# Patient Record
Sex: Female | Born: 1955 | Race: White | Hispanic: No | Marital: Married
Health system: Southern US, Community
[De-identification: ages and names within clinical notes are randomized; demographics above are authoritative.]

## PROBLEM LIST (undated history)

## (undated) DIAGNOSIS — M1711 Unilateral primary osteoarthritis, right knee: Secondary | ICD-10-CM

## (undated) DIAGNOSIS — Z1231 Encounter for screening mammogram for malignant neoplasm of breast: Secondary | ICD-10-CM

## (undated) DIAGNOSIS — N6325 Unspecified lump in the left breast, overlapping quadrants: Secondary | ICD-10-CM

## (undated) DIAGNOSIS — Z853 Personal history of malignant neoplasm of breast: Principal | ICD-10-CM

## (undated) DIAGNOSIS — R9230 Dense breasts, unspecified: Secondary | ICD-10-CM

## (undated) DIAGNOSIS — R350 Frequency of micturition: Secondary | ICD-10-CM

## (undated) DIAGNOSIS — I890 Lymphedema, not elsewhere classified: Secondary | ICD-10-CM

## (undated) DIAGNOSIS — R922 Inconclusive mammogram: Secondary | ICD-10-CM

## (undated) DIAGNOSIS — D508 Other iron deficiency anemias: Principal | ICD-10-CM

## (undated) DIAGNOSIS — R928 Other abnormal and inconclusive findings on diagnostic imaging of breast: Secondary | ICD-10-CM

---

## 2010-04-02 ENCOUNTER — Ambulatory Visit (HOSPITAL_COMMUNITY): Admission: RE | Admit: 2010-04-02 | Discharge: 2010-04-02 | Payer: Self-pay | Admitting: Family Medicine

## 2010-05-24 ENCOUNTER — Other Ambulatory Visit: Admission: RE | Admit: 2010-05-24 | Discharge: 2010-05-24 | Payer: Self-pay | Admitting: Obstetrics & Gynecology

## 2010-12-01 ENCOUNTER — Other Ambulatory Visit: Payer: Self-pay | Admitting: Endocrinology

## 2010-12-01 DIAGNOSIS — E049 Nontoxic goiter, unspecified: Secondary | ICD-10-CM

## 2010-12-07 ENCOUNTER — Ambulatory Visit
Admission: RE | Admit: 2010-12-07 | Discharge: 2010-12-07 | Disposition: A | Discharge status: Home / Self Care | Payer: 59 | Source: Ambulatory Visit | Attending: Endocrinology | Admitting: Endocrinology

## 2010-12-07 DIAGNOSIS — E049 Nontoxic goiter, unspecified: Secondary | ICD-10-CM

## 2011-01-31 ENCOUNTER — Ambulatory Visit (HOSPITAL_COMMUNITY)
Admission: RE | Admit: 2011-01-31 | Discharge: 2011-01-31 | Disposition: A | Discharge status: Home / Self Care | Payer: 59 | Source: Ambulatory Visit | Attending: Family Medicine | Admitting: Family Medicine

## 2011-01-31 ENCOUNTER — Other Ambulatory Visit: Payer: Self-pay | Admitting: Family Medicine

## 2011-01-31 DIAGNOSIS — M79604 Pain in right leg: Secondary | ICD-10-CM

## 2011-01-31 DIAGNOSIS — R609 Edema, unspecified: Secondary | ICD-10-CM

## 2011-01-31 DIAGNOSIS — M79609 Pain in unspecified limb: Secondary | ICD-10-CM | POA: Insufficient documentation

## 2011-01-31 DIAGNOSIS — M7989 Other specified soft tissue disorders: Secondary | ICD-10-CM | POA: Insufficient documentation

## 2011-02-01 ENCOUNTER — Emergency Department (HOSPITAL_COMMUNITY)
Admission: EM | Admit: 2011-02-01 | Discharge: 2011-02-01 | Disposition: A | Discharge status: Home / Self Care | Payer: 59 | Attending: Emergency Medicine | Admitting: Emergency Medicine

## 2011-02-01 ENCOUNTER — Emergency Department (HOSPITAL_COMMUNITY): Payer: 59

## 2011-02-01 DIAGNOSIS — M25569 Pain in unspecified knee: Secondary | ICD-10-CM | POA: Insufficient documentation

## 2011-02-01 DIAGNOSIS — E079 Disorder of thyroid, unspecified: Secondary | ICD-10-CM | POA: Insufficient documentation

## 2011-02-01 DIAGNOSIS — IMO0001 Reserved for inherently not codable concepts without codable children: Secondary | ICD-10-CM | POA: Insufficient documentation

## 2011-04-08 ENCOUNTER — Ambulatory Visit (HOSPITAL_BASED_OUTPATIENT_CLINIC_OR_DEPARTMENT_OTHER)
Admission: RE | Admit: 2011-04-08 | Discharge: 2011-04-08 | Disposition: A | Discharge status: Home / Self Care | Payer: No Typology Code available for payment source | Source: Ambulatory Visit | Attending: Orthopedic Surgery | Admitting: Orthopedic Surgery

## 2011-04-08 DIAGNOSIS — Z01812 Encounter for preprocedural laboratory examination: Secondary | ICD-10-CM | POA: Insufficient documentation

## 2011-04-08 DIAGNOSIS — Z01810 Encounter for preprocedural cardiovascular examination: Secondary | ICD-10-CM | POA: Insufficient documentation

## 2011-04-08 DIAGNOSIS — M2240 Chondromalacia patellae, unspecified knee: Secondary | ICD-10-CM | POA: Insufficient documentation

## 2011-04-08 DIAGNOSIS — M23329 Other meniscus derangements, posterior horn of medial meniscus, unspecified knee: Secondary | ICD-10-CM | POA: Insufficient documentation

## 2011-04-08 DIAGNOSIS — M6750 Plica syndrome, unspecified knee: Secondary | ICD-10-CM | POA: Insufficient documentation

## 2011-04-11 NOTE — Op Note (Signed)
NAMEODEAN, MCELWAIN               ACCOUNT NO.:  [id number]  MEDICAL RECORD NO.:  [id number]  LOCATION:                                 FACILITY:  PHYSICIAN:  Eulas Post, MD    DATE OF BIRTH:  1956-04-10  DATE OF PROCEDURE:  04/08/2011 DATE OF DISCHARGE:                              OPERATIVE REPORT   PREOPERATIVE DIAGNOSIS:  Right knee medial meniscus tear.  POSTOPERATIVE DIAGNOSES: 1. Right knee posterior horn medial meniscus tear. 2. Grade III patellar chondromalacia superiorly. 3. Grade 4 lateral femoral condyle chondromalacia in a small linear     pattern on the lateral aspect of the femoral trochlea. 4. Grade 3 extensive medial femoral condyle chondromalacia. 5. Hypertrophic medial synovial plica.  OPERATIVE PROCEDURE:  Right knee arthroscopy with partial medial meniscectomy, chondroplasty of the patellofemoral joint and the lateral femoral condyle, as well as the medial femoral condyle, and excision of synovial plica.  ATTENDING SURGEON:  Eulas Post, MD  FIRST ASSISTANT:  Janace Litten, OPA  PREOPERATIVE INDICATIONS:  Ms. Catherine Mcneil is a 55 year old woman who complained of right medial-sided knee pain and difficulty getting up and progressive loss of function.  She failed conservative measures and elected for arthroscopic intervention.  The risks, benefits and alternatives were discussed with her before the procedure including but not limited to risks of infection, bleeding, nerve injury, progression of knee pain and arthritis, incomplete relief of pain, cardiopulmonary complications, blood clots, among others and she is willing to proceed.  OPERATIVE FINDINGS:  The superior pole of the patella had an extensive uncontained area of grade 4 chondromalacia.  There was also a linear area along the femoral trochlea that had grade 4 chondral loss as well. This was primarily on the lateral aspect of the femoral trochlea.  The medial femoral condyle had a  fairly hypertrophic plica which was abrading the medial femoral condyle.  There was also extensive poorly contained grade 3 chondromalacia of the medial femoral condyle.  The tibial condyle on the medial side had some grade 3 and some grade 2 changes.  The posterior horn of the medial meniscus had some extrusion as well as a radial tear at the back.  The lateral femoral condyle was in relatively good condition with the exception of an 8 mm x 6 mm area of grade 2 chondromalacia, possibly grade 3 that was poorly contained that came into weightbearing at full extension.  The lateral meniscus was intact.  The ACL and PCL were also intact.  OPERATIVE PROCEDURE:  The patient was brought to the operating room and placed in supine position.  General anesthesia was administered.  The right lower extremity was prepped and draped in the usual sterile fashion.  Diagnostic arthroscopy was carried out with the above-named findings.  I used the arthroscopic shaver to debride the undersurface of the patella as well as the femoral trochlea and also the lateral femoral condyle and the medial femoral condyle.  I used the arthroscopic biter, as well as a shaver to debride the posterior horn of the medial meniscus back to a smooth rim.  I did not perform a microfracture given the multiple uncontained lesions.  Once  this had been achieved, I irrigated the knee copiously and removed the arthroscopic instruments and closed the portals with Monocryl and Steri-Strips and sterile gauze.  The patient was awakened and returned to PACU in stable and satisfactory condition.  There were no complications and she tolerated the procedure well.     Eulas Post, MD     JPL/MEDQ  D:  04/08/2011  T:  04/08/2011  Job:  [id number]  Electronically Signed by Teryl Lucy MD on 04/11/2011 10:54:28 AM

## 2011-06-07 ENCOUNTER — Ambulatory Visit (HOSPITAL_COMMUNITY)
Admission: RE | Admit: 2011-06-07 | Discharge: 2011-06-07 | Disposition: A | Discharge status: Home / Self Care | Payer: No Typology Code available for payment source | Source: Ambulatory Visit | Attending: Orthopedic Surgery | Admitting: Orthopedic Surgery

## 2011-06-07 DIAGNOSIS — M25569 Pain in unspecified knee: Secondary | ICD-10-CM | POA: Insufficient documentation

## 2011-06-07 DIAGNOSIS — M25669 Stiffness of unspecified knee, not elsewhere classified: Secondary | ICD-10-CM | POA: Insufficient documentation

## 2011-06-07 DIAGNOSIS — M6281 Muscle weakness (generalized): Secondary | ICD-10-CM | POA: Insufficient documentation

## 2011-06-07 DIAGNOSIS — R29898 Other symptoms and signs involving the musculoskeletal system: Secondary | ICD-10-CM | POA: Insufficient documentation

## 2011-06-07 DIAGNOSIS — IMO0001 Reserved for inherently not codable concepts without codable children: Secondary | ICD-10-CM | POA: Insufficient documentation

## 2011-06-07 NOTE — Patient Instructions (Addendum)
HEP

## 2011-06-07 NOTE — Progress Notes (Signed)
Physical Therapy Evaluation  Patient Details  Name: Catherine Mcneil MRN: [id number] Date of Birth: [birth year]  Today's Date: 06/07/2011 Time: 6578-4696 Time Calculation (min): 42 min Visit#: 1 of 8 Re-eval: 07/07/11  Past Medical History: No past medical history on file. Past Surgical History: No past surgical history on file.  Subjective Symptoms/Limitations Symptoms: Pt states that she was walking her dog and twisted her knee.  The patient state that a week latere she heard a snap in her knee and there had been significant pain on and off ever since.  The injury was approximately 4 months ago .  She had her surgery was on July 13th .  She states that she had immediate relief after the surgery.  The patient states that she has noticed that her leg seems weaker.  The patient is now being referred to therapy to maximize the patient's funciton. How long can you sit comfortably?: Pt feels stiff after sitting for a half hour but is able to sit for longer.  Pt states that she has increased pain sit to stand.   Pt states that driving bothers her knee after 30 minutes. How long can you stand comfortably?: Pt only has difficulty secondary to her back,(pt has chronic LBP). How long can you walk comfortably?: Pt is able to walk for thirty minutes. Special Tests: Pt has difficulty going down steps. Pain Assessment Currently in Pain?: No/denies (worst pain in the past week has been a 6/10)  Precautions/Restrictions     Prior Functioning  Home Living Type of Home: House Lives With: Spouse Bathroom Shower/Tub: Tub/shower unit (pt can feel knee when she is stepping over the tub.) Prior Function Level of Independence: Independent with basic ADLs Driving: Yes Able to Take Stairs Reciprically: Yes Vocation: Unemployed Leisure: Hobbies-yes (Comment) Comments: walking  Cognition Cognition Overall Cognitive Status: Appears within functional limits for tasks assessed Arousal/Alertness:  Awake/alert Orientation Level: Oriented X4  Sensation/Coordination/Flexibility    Assessment RLE AROM (degrees) Right Knee Extension 0-130: 7  Right Knee Flexion 0-140: 110  RLE Strength Right Hip Flexion: 5/5 Right Hip Extension: 4/5 Right Hip ABduction: 5/5 Right Hip ADduction: 5/5 Right Knee Flexion: 3+/5 Right Knee Extension: 3+/5 Right Ankle Dorsiflexion: 5/5  Mobility (including Balance)       Exercise/Treatments   For strengthening and stretching     Physical Therapy Assessment and Plan PT Assessment and Plan Clinical Impression Statement: Pt with decreased ROM and strength affecting ADL's Rehab Potential: Good PT Frequency: Min 2X/week PT Duration: 4 weeks PT Treatment/Interventions: Therapeutic exercise PT Plan: see two times a week for 4 weeks;  add standing functional squats, lateral step-ups, terminal extension and elliptical.    Goals PT Short Term Goals Time to Complete Goals: 2 weeks PT Short Term Goal 1: I HEP PT Short Term Goal 2: increase pain 1/2 grade to allow pt to come sit to stand without pain. PT Short Term Goal 3: ROM 0-120 to allow going over the tub with ease. PT Long Term Goals PT Long Term Goal 1: I HEP 4 wk PT Long Term Goal 2: Strength to be increased one grade to be able to walk 60 min.-4 wk Long Term Goal 3: Pain to be no greater than a 2-4 wk  Problem List Patient Active Problem List  Diagnoses  . Knee pain  . Leg weakness    PT - End of Session Equipment Utilized During Treatment: Gait belt Activity Tolerance: Patient tolerated treatment well General Behavior During Session: [facility] for  tasks performed Cognition: [facility] for tasks performed   Yavier Snider,CINDY 06/07/2011, 2:37 PM  Physician Documentation Your signature is required to indicate approval of the treatment plan as stated above.  Please sign and either send electronically or make a copy of this report for your files and return this physician signed original.     Please mark one 1.__approve of plan  2. ___approve of plan with the following conditions.   ______________________________                                                          _____________________ Physician Signature                                                                                                             Date

## 2011-06-08 ENCOUNTER — Other Ambulatory Visit: Payer: Self-pay | Admitting: Obstetrics & Gynecology

## 2011-06-08 ENCOUNTER — Other Ambulatory Visit (HOSPITAL_COMMUNITY)
Admission: RE | Admit: 2011-06-08 | Discharge: 2011-06-08 | Disposition: A | Discharge status: Home / Self Care | Payer: No Typology Code available for payment source | Source: Ambulatory Visit | Attending: Obstetrics & Gynecology | Admitting: Obstetrics & Gynecology

## 2011-06-08 DIAGNOSIS — Z01419 Encounter for gynecological examination (general) (routine) without abnormal findings: Secondary | ICD-10-CM | POA: Insufficient documentation

## 2011-06-13 ENCOUNTER — Ambulatory Visit (HOSPITAL_COMMUNITY)
Admission: RE | Admit: 2011-06-13 | Discharge: 2011-06-13 | Disposition: A | Discharge status: Home / Self Care | Payer: No Typology Code available for payment source | Source: Ambulatory Visit | Attending: *Deleted | Admitting: *Deleted

## 2011-06-13 NOTE — Progress Notes (Signed)
Physical Therapy Treatment Patient Details  Name: Catherine Mcneil MRN: [id number] Date of Birth: [birth year]  Today's Date: 06/13/2011 Time: 4098-1191 Time Calculation (min): 37 min Visit#: 2 of 8 Re-eval: 07/07/11 Charges: Therex x 30'  Subjective: Symptoms/Limitations Symptoms: No pain just stiffness. Pain Assessment Currently in Pain?: No/denies   Exercise/Treatments   06/13/11 0700  Knee Exercises: Stretches  Active Hamstring Stretch 3 reps;30 seconds  Knee Exercises: Aerobic  Elliptical 3'@L1   Knee Exercises: Standing  Lateral Step Up 10 reps;Right;Step Height: 4"  Forward Step Up 10 reps;Right;Step Height: 4"  Functional Squat 10 reps  Knee Exercises: Seated  Long Arc Quad 15 reps  Long Arc Quad Weight 3 lbs.  Knee Exercises: Supine  Quad Sets 15 reps  Heel Slides 15 reps  Bridges 10 reps  Straight Leg Raises 15 reps;Right  Knee Exercises: Prone  Hamstring Curl 15 reps;Limitations  Hamstring Curl Limitations 3#  Hip Extension 10 reps;Limitations  Hip Extension Limitations 3#    Physical Therapy Assessment and Plan PT Assessment and Plan Clinical Impression Statement: Pt completes therex without difficulty and minimal need for cueing. Began new therex and elliptcal to increase strength and endurance. PT Treatment/Interventions: Therapeutic exercise PT Plan: Continue to progress per PT POC. Begin step downs on 4" step next tx.     Problem List Patient Active Problem List  Diagnoses  . Knee pain  . Leg weakness    PT - End of Session Activity Tolerance: Patient tolerated treatment well General Behavior During Session: [facility] for tasks performed Cognition: [facility] for tasks performed  Antonieta Iba 06/13/2011, 2:33 PM

## 2011-06-16 ENCOUNTER — Ambulatory Visit (HOSPITAL_COMMUNITY)
Admission: RE | Admit: 2011-06-16 | Discharge: 2011-06-16 | Disposition: A | Discharge status: Home / Self Care | Payer: No Typology Code available for payment source | Source: Ambulatory Visit | Attending: Physical Therapy | Admitting: Physical Therapy

## 2011-06-16 DIAGNOSIS — M25569 Pain in unspecified knee: Secondary | ICD-10-CM

## 2011-06-16 DIAGNOSIS — R29898 Other symptoms and signs involving the musculoskeletal system: Secondary | ICD-10-CM

## 2011-06-16 NOTE — Progress Notes (Signed)
Physical Therapy Treatment Patient Details  Name: QUINETTE HENTGES MRN: [id number] Date of Birth: 05-May-1956  Today's Date: 06/16/2011 Time: 1400-1440 Time Calculation (min): 40 min Visit#: 3  of 8   Re-eval: 07/07/11    Subjective: Symptoms/Limitations Symptoms: Just walking I'm okay but I don't have the same mobility.        Exercise/Treatments  per doc flow sheet. Physical Therapy Assessment and Plan PT Assessment and Plan Clinical Impression Statement: Pt unable to compete step down with 6" step without pain changed to a 4" Rehab Potential: Good PT Frequency: Min 2X/week PT Treatment/Interventions: Therapeutic exercise PT Plan: begin squats, step up an bosu next treatment    Goals  I with all activities without pain Problem List Patient Active Problem List  Diagnoses  . Knee pain  . Leg weakness    PT - End of Session Activity Tolerance: Patient tolerated treatment well General Behavior During Session: [facility] for tasks performed Cognition: [facility] for tasks performed  RUSSELL,CINDY 06/16/2011, 2:36 PM

## 2011-06-20 ENCOUNTER — Ambulatory Visit (HOSPITAL_COMMUNITY): Payer: No Typology Code available for payment source | Admitting: Physical Therapy

## 2011-06-20 ENCOUNTER — Telehealth (HOSPITAL_COMMUNITY): Payer: Self-pay

## 2011-06-23 ENCOUNTER — Ambulatory Visit (HOSPITAL_COMMUNITY)
Admission: RE | Admit: 2011-06-23 | Discharge: 2011-06-23 | Disposition: A | Discharge status: Home / Self Care | Payer: No Typology Code available for payment source | Source: Ambulatory Visit | Attending: *Deleted | Admitting: *Deleted

## 2011-06-23 NOTE — Progress Notes (Signed)
Physical Therapy Treatment Patient Details  Name: Catherine Mcneil MRN: [id number] Date of Birth: 1956/03/02  Today's Date: 06/23/2011 Time: 1350-1430 Time Calculation (min): 40 min Visit#: 4  of 8   Re-eval: 07/07/11 Charges: Therex x 32'  Subjective: Symptoms/Limitations Symptoms: My pain is better now but it was a 7/10 before I took my arthritis medicine. Pain Assessment Currently in Pain?: Yes Pain Score:   2 Pain Location: Knee Pain Orientation: Right   Exercise/Treatments  06/23/11 0700  Knee Exercises: Aerobic  Elliptical 5'@L3   Knee Exercises: Standing  Heel Raises 10 reps;Limitations  Heel Raises Limitations RLE only  Knee Flexion Strengthening;Right;10 reps;Limitations  Knee Flexion Limitations 5#  Forward Lunges 10 reps  Side Lunges 10 reps  Terminal Knee Extension Right;15 reps;Theraband  Theraband Level (Terminal Knee Extension) Level 4 (Blue)  Lateral Step Up 15 reps;Right;Step Height: 4"  Forward Step Up 15 reps;Step Height: 4";Right  Step Down 15 reps;Right;Step Height: 4"  Rocker Board 2 minutes;Limitations  Rocker Board Limitations w/o UE assist  Knee Exercises: Supine  Bridges 20 reps  Knee Exercises: Prone  Hamstring Curl 15 reps  Hamstring Curl Limitations 5#  Hip Extension 15 reps  Hip Extension Limitations 5#     Physical Therapy Assessment and Plan PT Assessment and Plan Clinical Impression Statement: Pt completes therex with minimal difficulty. Pt displays increased quad strength/power with step ups. PT Treatment/Interventions: Therapeutic exercise PT Plan: Continue to progress per PT POC. Begin step up on bosu next treatment      Problem List Patient Active Problem List  Diagnoses  . Knee pain  . Leg weakness    PT - End of Session Activity Tolerance: Patient tolerated treatment well General Behavior During Session: [facility] for tasks performed Cognition: [facility] for tasks performed  Antonieta Iba 06/23/2011, 4:56 PM

## 2011-06-27 ENCOUNTER — Ambulatory Visit (HOSPITAL_COMMUNITY)
Admission: RE | Admit: 2011-06-27 | Discharge: 2011-06-27 | Disposition: A | Discharge status: Home / Self Care | Payer: No Typology Code available for payment source | Source: Ambulatory Visit | Attending: Orthopedic Surgery | Admitting: Orthopedic Surgery

## 2011-06-27 DIAGNOSIS — M25569 Pain in unspecified knee: Secondary | ICD-10-CM | POA: Insufficient documentation

## 2011-06-27 DIAGNOSIS — IMO0001 Reserved for inherently not codable concepts without codable children: Secondary | ICD-10-CM | POA: Insufficient documentation

## 2011-06-27 DIAGNOSIS — M25669 Stiffness of unspecified knee, not elsewhere classified: Secondary | ICD-10-CM | POA: Insufficient documentation

## 2011-06-27 DIAGNOSIS — M6281 Muscle weakness (generalized): Secondary | ICD-10-CM | POA: Insufficient documentation

## 2011-06-27 NOTE — Progress Notes (Signed)
Physical Therapy Treatment Patient Details  Name: Catherine Mcneil MRN: [id number] Date of Birth: April 21, 1956  Today's Date: 06/27/2011 Time: 0146-0228 Time Calculation (min): 42 min Visit#: 5  of 8   Re-eval: 07/07/11 Charges: Therex x 34'  Subjective: Symptoms/Limitations Symptoms: My knee hurts when I move it certain ways, it's not hurting all the time. Pain Assessment Currently in Pain?: Yes Pain Score:   3 Pain Location: Knee Pain Orientation: Right   Exercise/Treatments  Knee Exercises: Aerobic Elliptical 5'@L3   Knee Exercises: Machines for Strengthening Cybex Knee Extension 2plx10 Cybex Knee Flexion 2.5plx10  Knee Exercises: Standing Heel Raises 10 reps;Limitations Heel Raises Limitations RLE only Knee Flexion 15 reps;Limitations;Right Knee Flexion Limitations 5# Forward Lunges 10 reps Side Lunges 10 reps Lateral Step Up 20 reps;Step Height: 4";Right Forward Step Up 20 reps;Step Height: 4";Right Step Down 20 reps;Step Height: 4";Right Rocker Board 2 minutes;Limitations Rocker Board Limitations w/o UE assist  Knee Exercises: Supine Bridges 20 reps Straight Leg Raises 15 reps;Right  Knee Exercises: Sidelying Hip ABduction 20 reps;Limitations;Right Hip ABduction Limitations 3#  Knee Exercises: Prone Hamstring Curl 20 reps;Limitations Hamstring Curl Limitations5#    Physical Therapy Assessment and Plan PT Assessment and Plan Clinical Impression Statement: Began cybex knee ext/flex to increase quad/ham strength. Pt completes therex without difficulty or c/o increased pain.  PT Treatment/Interventions: Therapeutic exercise PT Plan: Progress to 6" step with step-ups next tx.     Problem List Patient Active Problem List  Diagnoses  . Knee pain  . Leg weakness    PT - End of Session Activity Tolerance: Patient tolerated treatment well General Behavior During Session: [facility] for tasks performed Cognition: [facility] for tasks performed  Antonieta Iba 06/27/2011, 2:32 PM

## 2011-06-30 ENCOUNTER — Ambulatory Visit (HOSPITAL_COMMUNITY): Payer: No Typology Code available for payment source | Admitting: *Deleted

## 2011-06-30 ENCOUNTER — Telehealth (HOSPITAL_COMMUNITY): Payer: Self-pay

## 2011-07-04 ENCOUNTER — Ambulatory Visit (HOSPITAL_COMMUNITY)
Admission: RE | Admit: 2011-07-04 | Discharge: 2011-07-04 | Disposition: A | Discharge status: Home / Self Care | Payer: No Typology Code available for payment source | Source: Ambulatory Visit | Attending: *Deleted | Admitting: *Deleted

## 2011-07-04 DIAGNOSIS — R29898 Other symptoms and signs involving the musculoskeletal system: Secondary | ICD-10-CM

## 2011-07-04 DIAGNOSIS — M25569 Pain in unspecified knee: Secondary | ICD-10-CM

## 2011-07-04 NOTE — Progress Notes (Signed)
Physical Therapy Treatment Patient Details  Name: Catherine Mcneil MRN: [id number] Date of Birth: 1956/01/28  Today's Date: 07/04/2011 Time: 6578-4696 Time Calculation (min): 42 min Visit#: 5  of 8   Re-eval: 07/07/11  Charge: therex 37 min  Subjective: Symptoms/Limitations Symptoms: Knee hurts worse when I'm off of it laying down in bed, feels better with when I'm standing on it.  Pain scale 3/10 standing, 5/10 supine. Pain Assessment Currently in Pain?: Yes Pain Score:   3 Pain Location: Knee Pain Orientation: Right  Objective:  Exercise/Treatments Knee Exercises: Aerobic  Elliptical 5'@L3    Knee Exercises: Machines for Strengthening  Cybex Knee Extension 2.5 pl 2x10  Cybex Knee Flexion 3.0 2x 10  Knee Exercises: Standing  Heel Raises 10 reps;Limitations RLE only Knee Flexion 20 reps;5#  Lateral Step Up 10 reps step height 6" right Forward Step Up 10 reps; step height 6" R  Step Down 10 reps; step height 6" R Rocker Board 2 minutes; no UE A; A/P;R/L TKE 20x 5" SLS >1' Vector stance 3x 10"  Physical Therapy Assessment and Plan PT Assessment and Plan Clinical Impression Statement: Increased vc needed today due to pt distracted with family issues.  Increased step height with weak eccentric control noted.   PT Plan: Continue with current POC, reeval due 07/07/2011.    Goals    Problem List Patient Active Problem List  Diagnoses  . Knee pain  . Leg weakness    PT - End of Session Activity Tolerance: Patient tolerated treatment well General Behavior During Session: [facility] for tasks performed Cognition: [facility] for tasks performed  Juel Burrow 07/04/2011, 2:49 PM

## 2011-07-07 ENCOUNTER — Ambulatory Visit (HOSPITAL_COMMUNITY): Payer: No Typology Code available for payment source | Admitting: Physical Therapy

## 2011-09-21 ENCOUNTER — Other Ambulatory Visit: Payer: Self-pay | Admitting: Obstetrics & Gynecology

## 2011-09-21 DIAGNOSIS — R599 Enlarged lymph nodes, unspecified: Secondary | ICD-10-CM

## 2011-10-12 ENCOUNTER — Ambulatory Visit (HOSPITAL_COMMUNITY)
Admission: RE | Admit: 2011-10-12 | Discharge: 2011-10-12 | Disposition: A | Discharge status: Home / Self Care | Payer: No Typology Code available for payment source | Source: Ambulatory Visit | Attending: Obstetrics & Gynecology | Admitting: Obstetrics & Gynecology

## 2011-10-12 ENCOUNTER — Other Ambulatory Visit: Payer: Self-pay | Admitting: Obstetrics & Gynecology

## 2011-10-12 DIAGNOSIS — Z9889 Other specified postprocedural states: Secondary | ICD-10-CM | POA: Insufficient documentation

## 2011-10-12 DIAGNOSIS — R599 Enlarged lymph nodes, unspecified: Secondary | ICD-10-CM

## 2011-10-12 DIAGNOSIS — N63 Unspecified lump in unspecified breast: Secondary | ICD-10-CM | POA: Insufficient documentation

## 2012-03-11 IMAGING — US US EXTREM LOW VENOUS*R*
1 series · 14 of 24 positions shown · non-contrast
Comparison: None

CLINICAL DATA: Right lower extremity pain and swelling

RIGHT LOWER EXTREMITY VENOUS DUPLEX ULTRASOUND
TECHNIQUE: Gray-scale sonography with graded compression, as well
as color Doppler and duplex ultrasound, were performed to evaluate
the deep venous system of the lower extremity from the level of the
common femoral vein through the popliteal and proximal calf veins.
Spectral Doppler was utilized to evaluate flow at rest and with
distal augmentation maneuvers.

[Series 1: us extrem low venous*right* · 27 acquisitions, 14 frames shown]
[im 1/27]
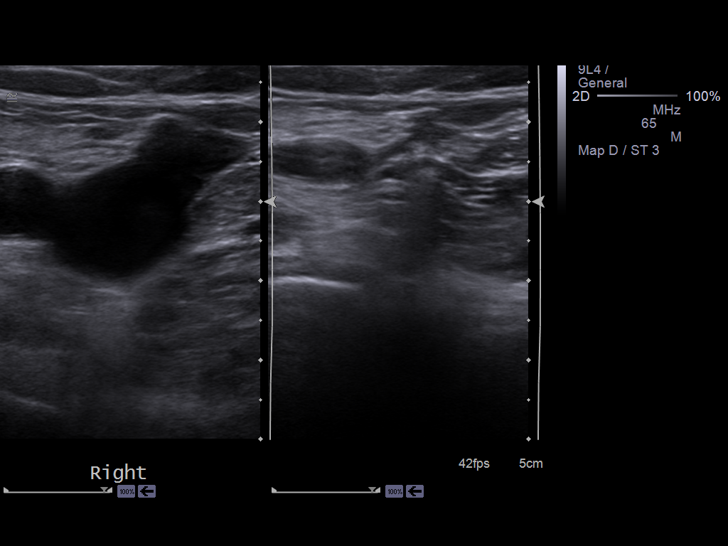
[im 3/27]
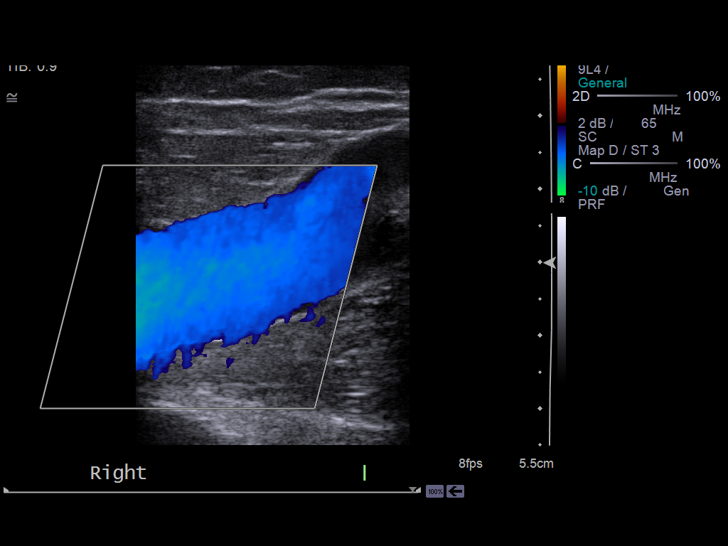
[im 5/27]
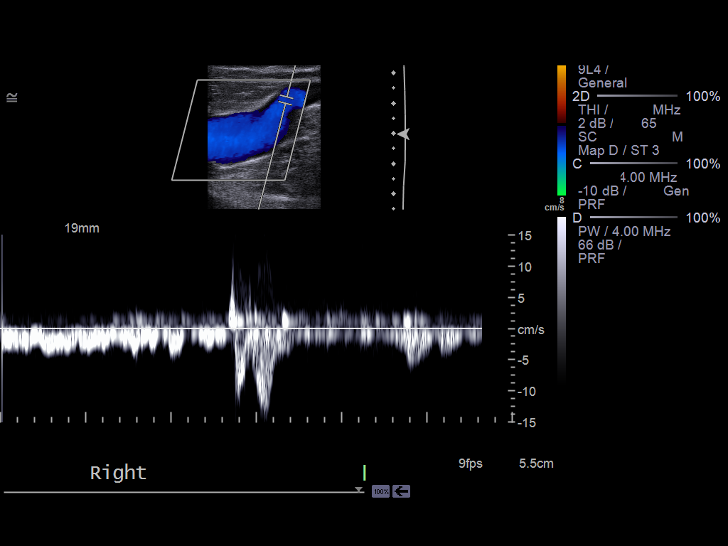
[im 7/27]
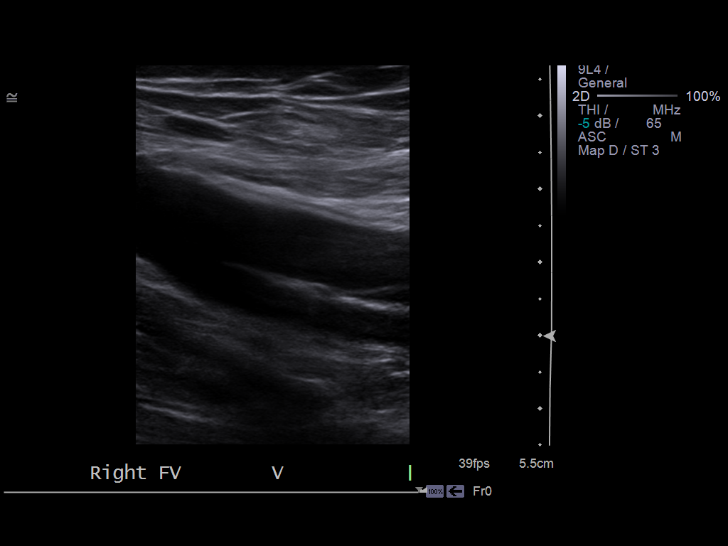
[im 8/27]
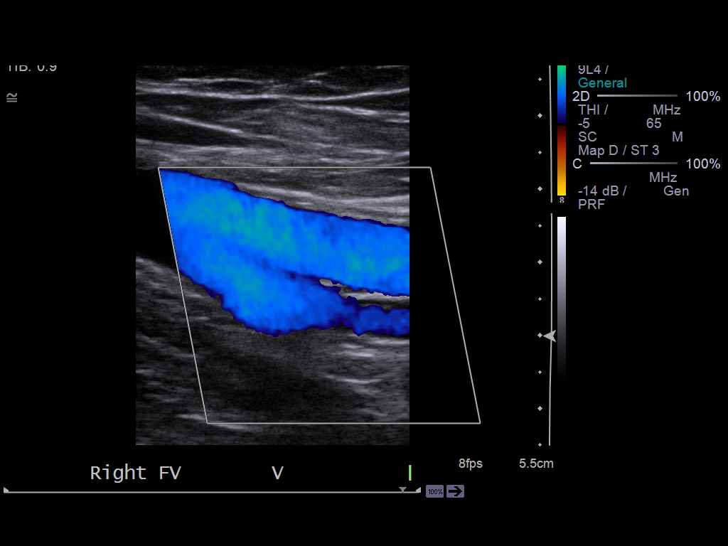
[im 11/27]
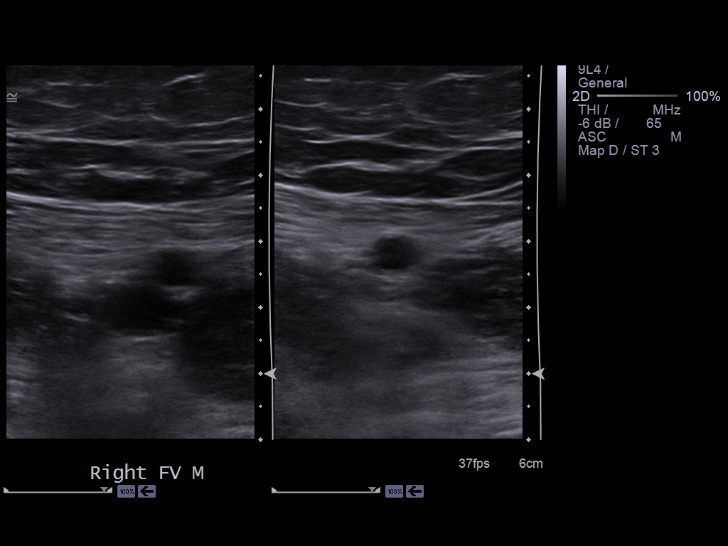
[im 13/27]
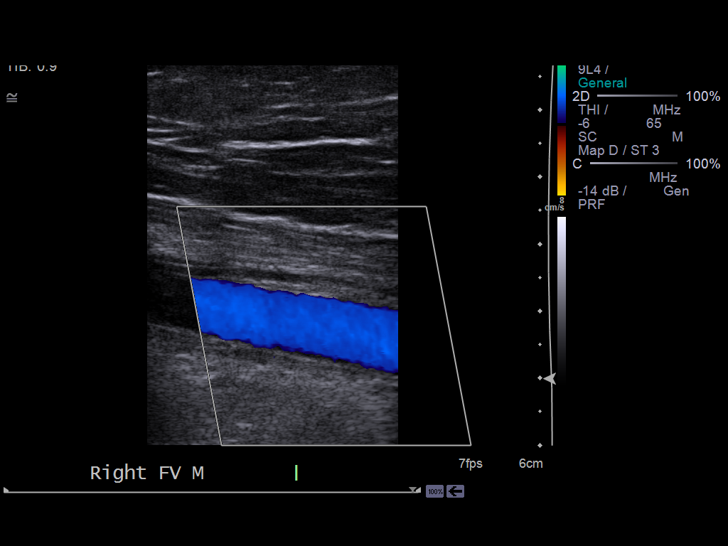
[im 15/27]
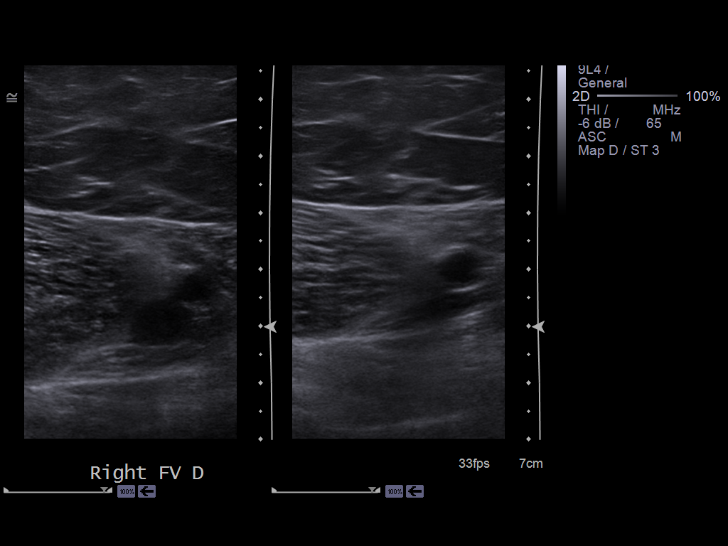
[im 17/27]
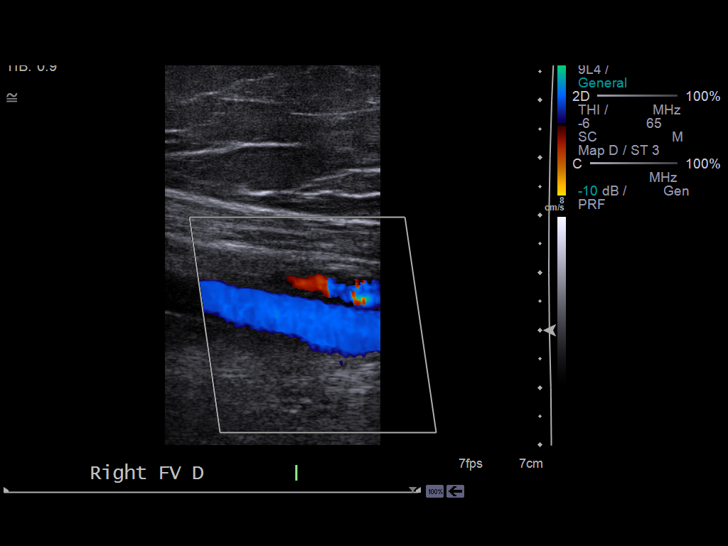
[im 20/27]
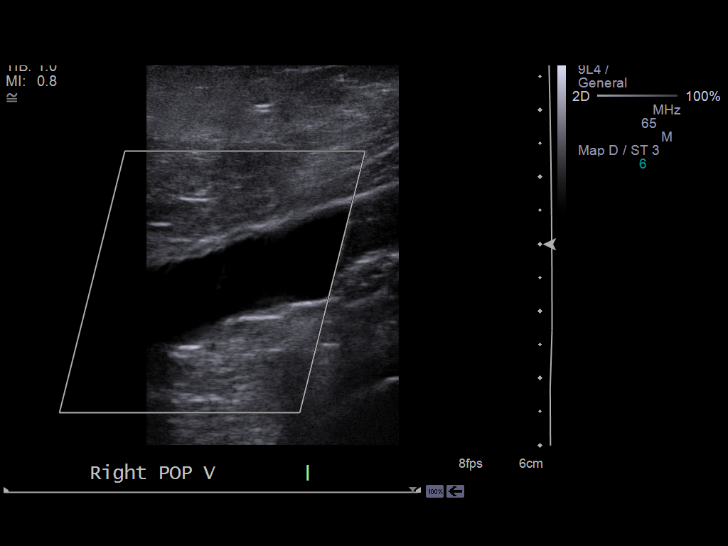
[im 22/27]
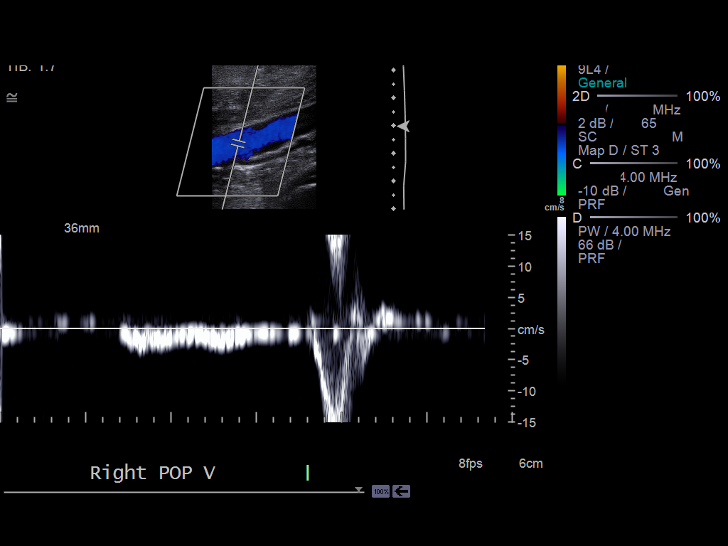
[im 23/27]
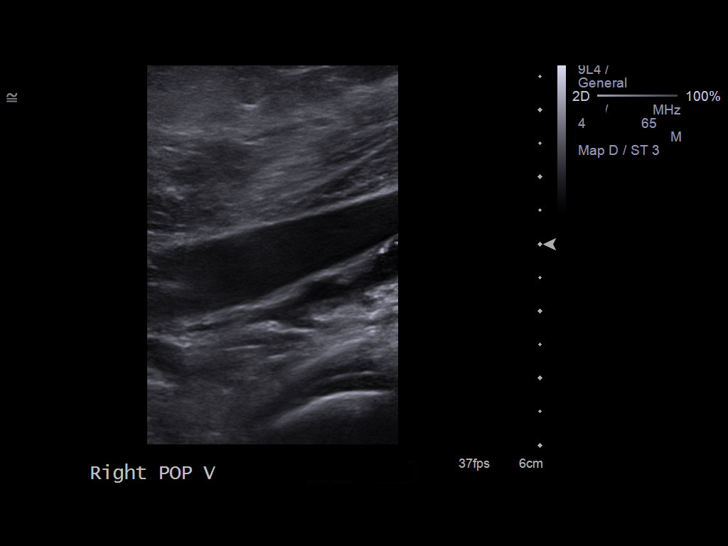
[im 24/27]
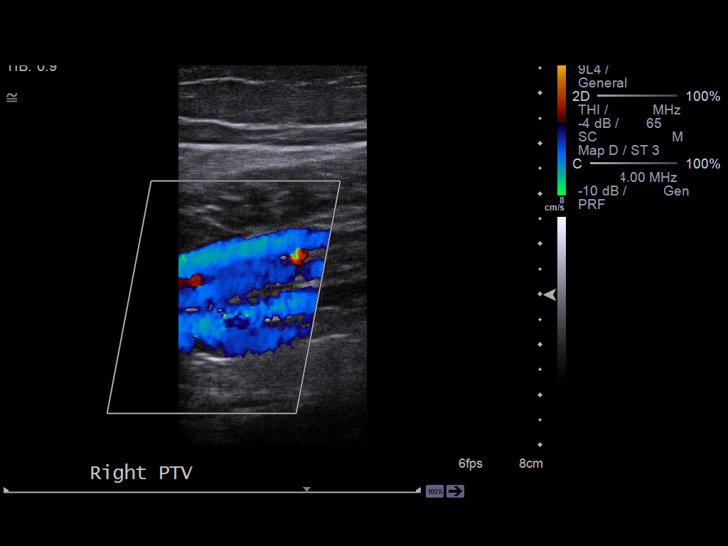
[im 27/27]
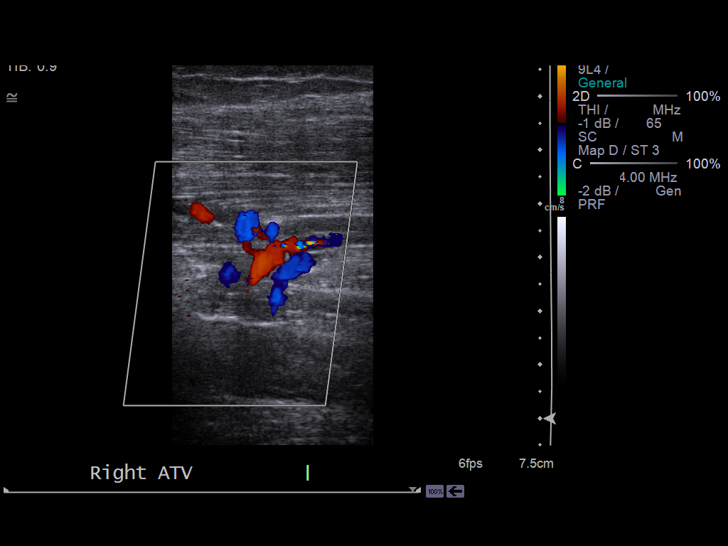

[14 of 24 positions shown; findings below may reference images not displayed]

FINDINGS: Deep venous system patent and compressible from right groin through
popliteal fossa.
Spontaneous venous flow present with intact augmentation and
evidence of respiratory phasicity.
No intraluminal thrombus identified.
Visualized portion of the greater saphenous system unremarkable.
Flow within the right popliteal vein appears slow but patent.
IMPRESSION: No evidence of deep venous thrombosis in the right lower extremity.

## 2012-03-12 IMAGING — CR DG KNEE COMPLETE 4+V*R*
4 series · 4 of 4 positions shown · non-contrast
Comparison: None

CLINICAL DATA: Right knee pain.

RIGHT KNEE - COMPLETE 4+ VIEW

[view not recorded (1 of 4)]
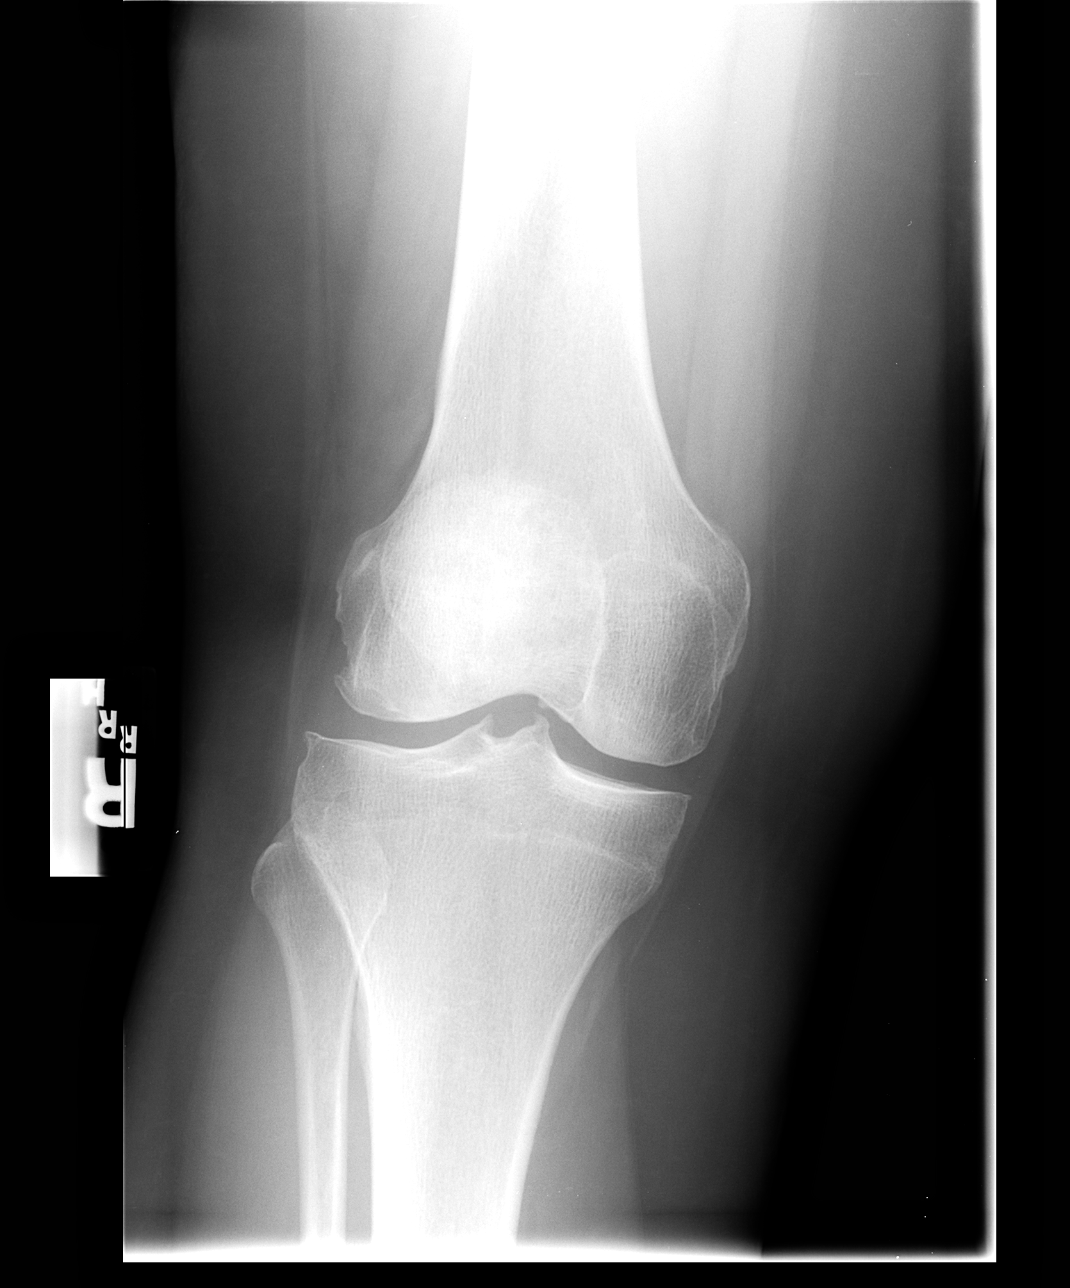

[view not recorded (2 of 4)]
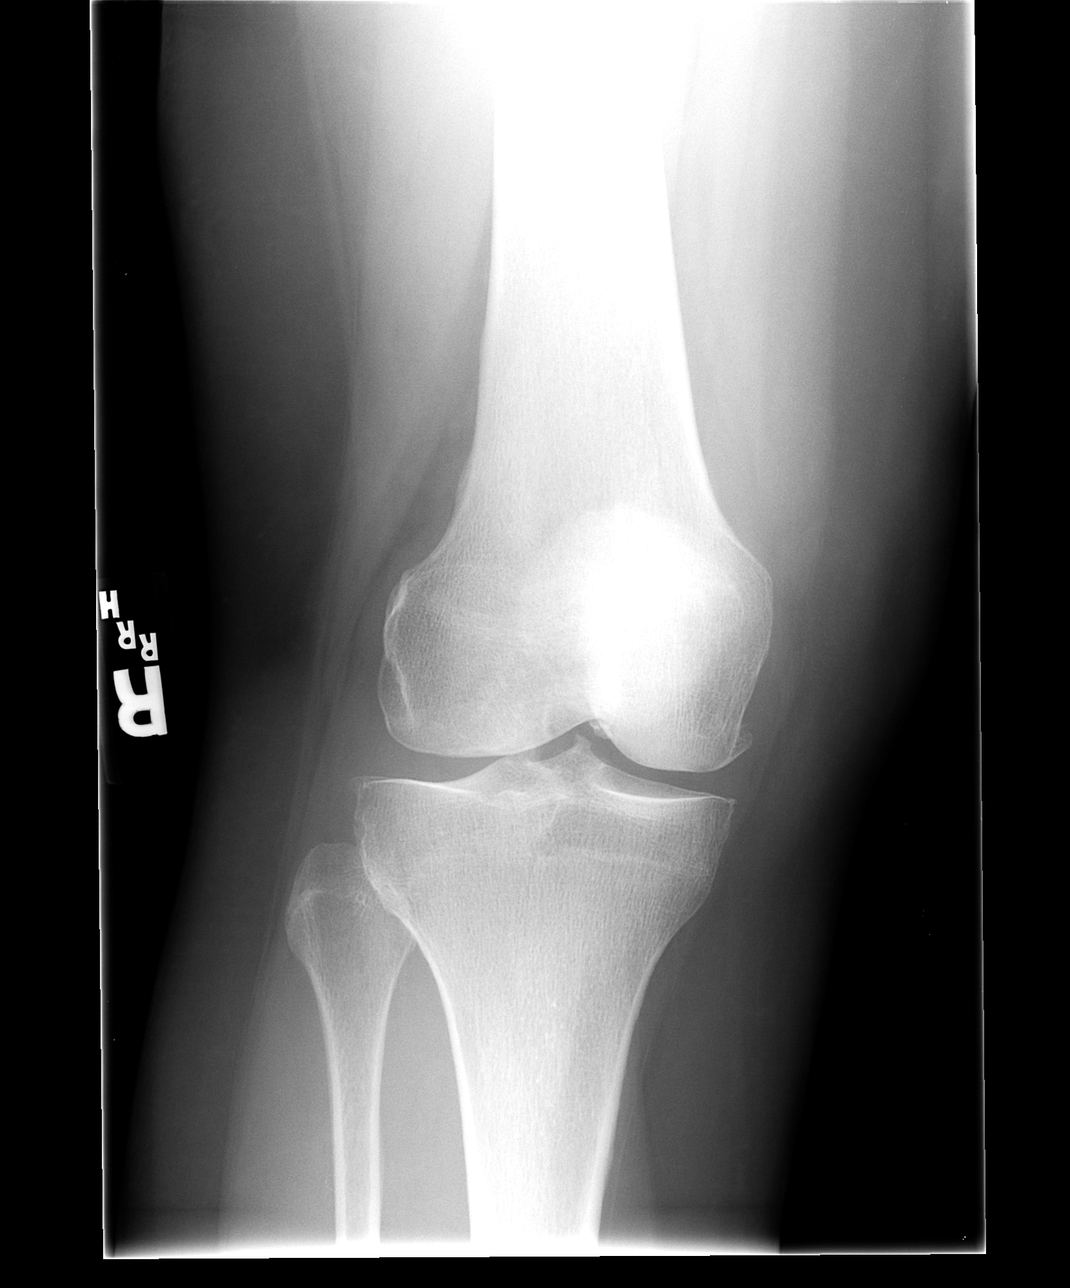

[view not recorded (3 of 4)]
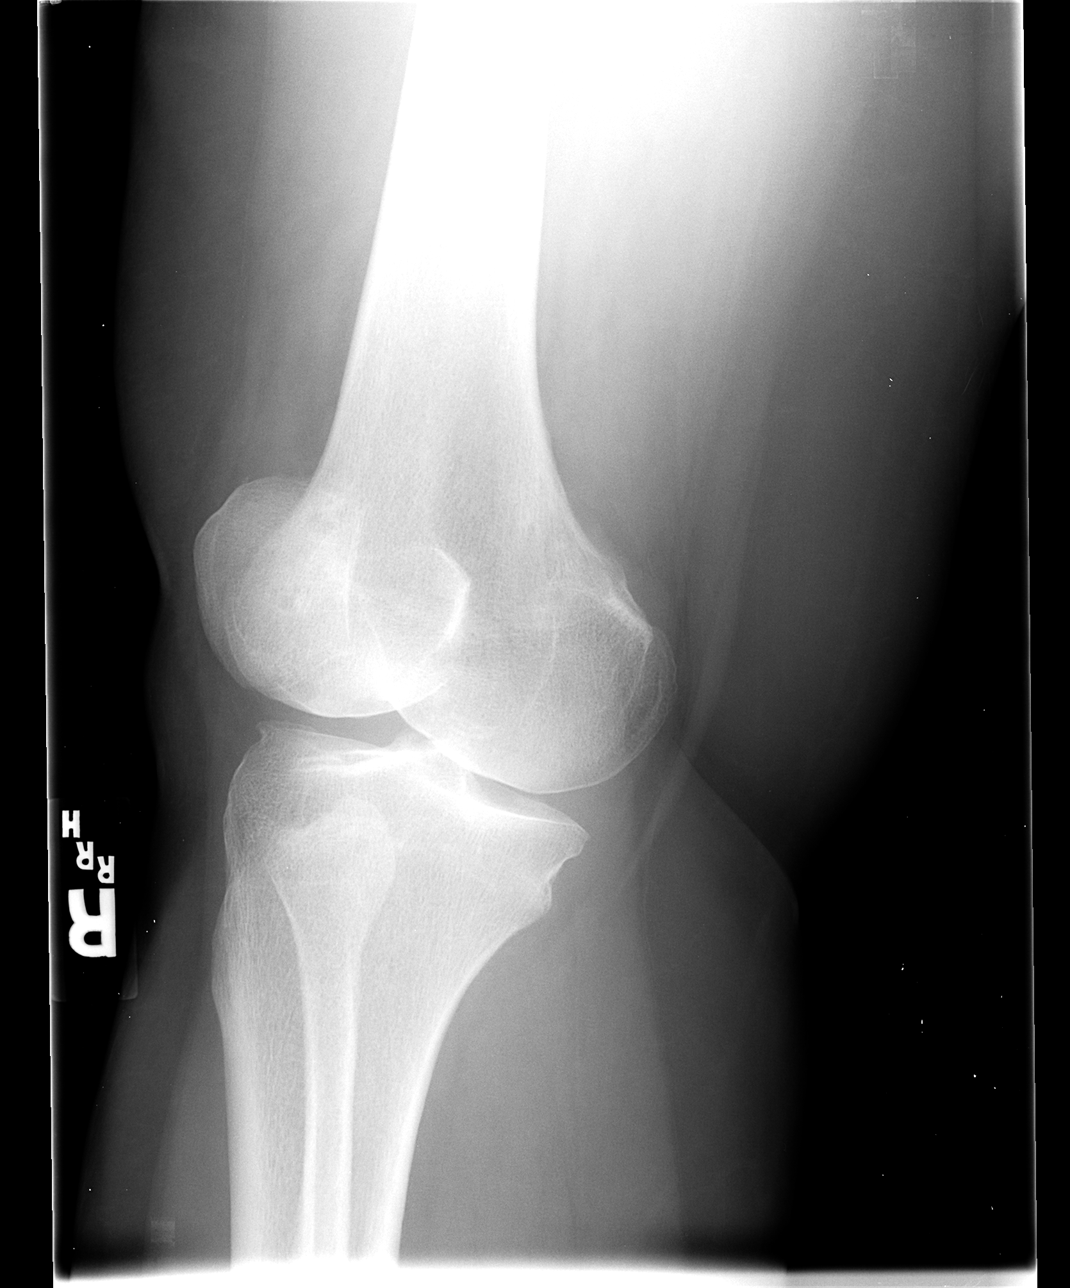

[view not recorded (4 of 4)]
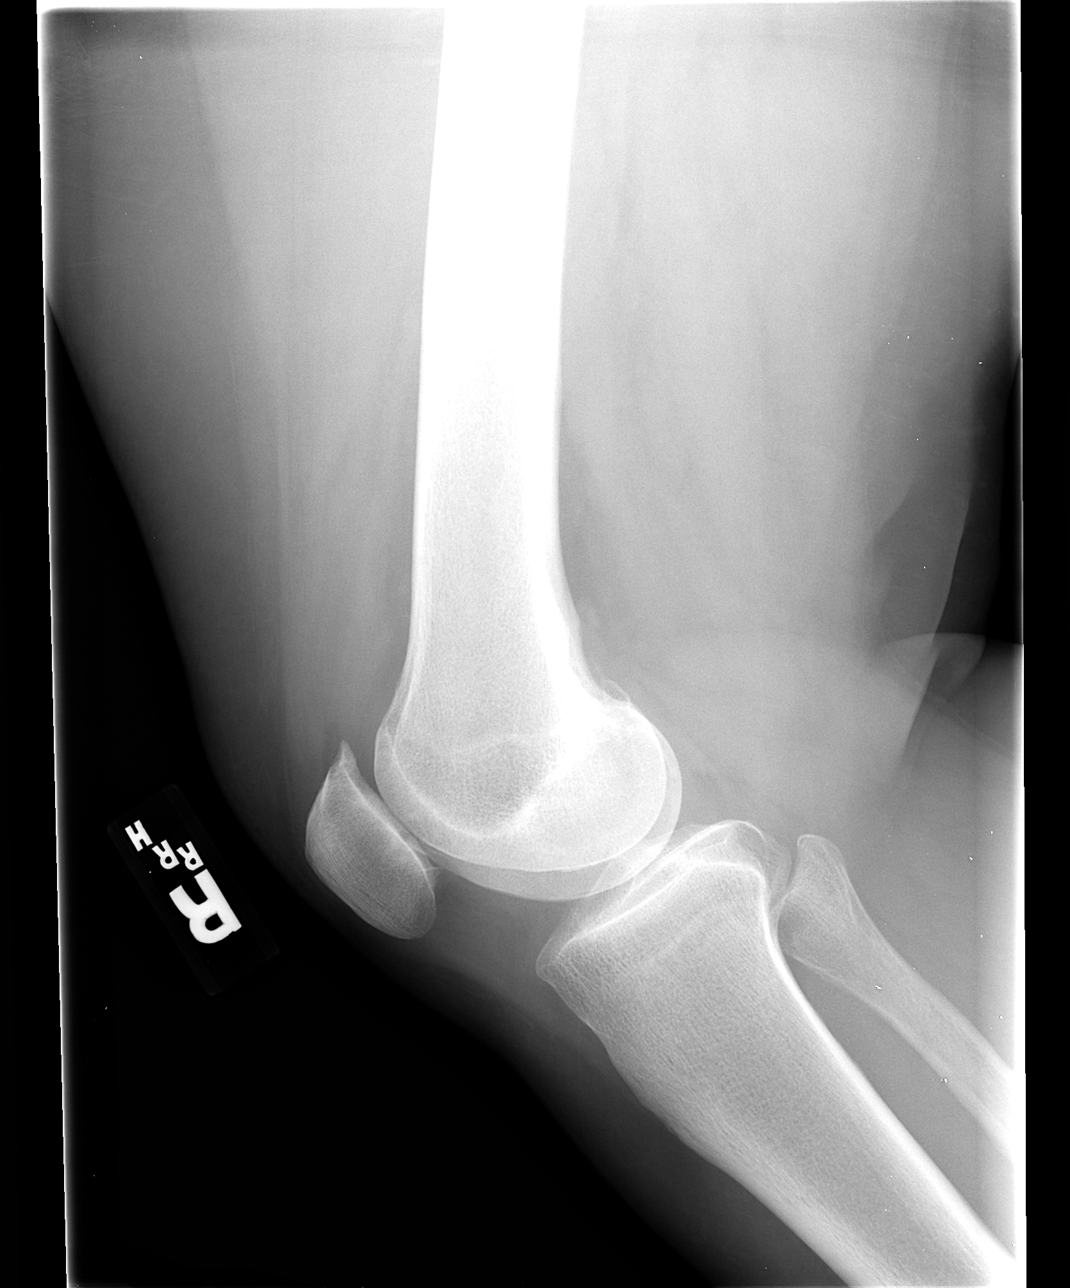

[4 of 4 positions shown; findings below may reference images not displayed]

FINDINGS: Mild degenerative joint disease within the right knee
with joint space narrowing and osteophyte formation.  Small joint
effusion. No acute bony abnormality.  Specifically, no fracture,
subluxation, or dislocation.  Soft tissues are intact.
IMPRESSION: Mild degenerative changes and small joint effusion.

## 2013-09-30 ENCOUNTER — Telehealth (INDEPENDENT_AMBULATORY_CARE_PROVIDER_SITE_OTHER): Payer: Self-pay

## 2013-09-30 NOTE — Telephone Encounter (Signed)
Opened in error

## 2015-07-30 NOTE — Procedures (Signed)
IntraOp Record - SFOR             IntraOp Record - SFOR Summary                                                                   Primary Physician:        Vickey Huger G.-MD    Case Number:              [id number]    Finalized Date/Time:      07/30/15 10:58:23    Pt. Name:                 Brandi Fritz, Brandi Fritz    D.O.B./Sex:               1956/04/09    Female    Med Rec #:                [id number]    Physician:                Vickey Huger G.-MD    Financial #:              [id number]    Pt. Type:                 S    Room/Bed:                 /    Admit/Disch:              07/30/15 08:17:00 -    Institution:       [facility] - Case Times                                                                                                         Entry 1                                                                                                          Patient      In Room Time             07/30/15 10:10:00               Out Room Time                   07/30/15 10:32:00    Anesthesia     Procedure  Start Time               07/30/15 10:18:00               Stop Time                       07/30/15 10:29:00    Last Modified By:         Emelda Fear RN, PATRICIA                              C 07/30/15 10:44:29      SFOR - Case Times Audit                                                                          07/30/15 10:44:29         Owner: Cain Sieve                               Modifier: [email or web address]                                                        <+> 1         Out Room Time     07/30/15 10:44:10         Owner: [email or web address]                               Modifier: [email or web address]                                                        <+> 1         Start Time        <+> 1         Stop Time        SFOR - Safety Checklist - Sign In                                                               Pre-Care Text:            A.10 Confirms patient identity A.20 Verifies operative procedure, surgical site, and laterality A.20.1 Verifies            consent for planned procedure A.30 Verifies allergies                              Entry 1  History/Physical on       Yes                             Procedure Consent               Yes    Chart                                                     on Chart     Site Marked (if           Yes    applicable)     Last Modified By:         Emelda Fear RN, PATRICIA                              C 07/30/15 10:13:06    Post-Care Text:            E.30 Evaluates verification process for correct patient, site, side, and level surgery      S[facility] - Case Attendance                                                                                                    Entry 1                         Entry 2                         Entry 3                                          Case Attendee             Vickey Huger G.-MD           FERGUSON, RN, PATRICIA Zack Seal, RN, Francine Graven    Role Performed            Surgeon Primary                 Circulator                      Observer    Time In                   07/30/15 10:10:00               07/30/15 10:10:00               07/30/15 10:10:00    Time Out                  07/30/15 10:32:00  07/30/15 10:32:00               07/30/15 10:32:00    Procedure                 Hysteroscopy with or            Hysteroscopy with or            Hysteroscopy with or                              without Dilatation              without Dilatation              without Dilatation    Last Modified By:         Emelda Fear RN, Dorene Sorrow, RN, PATRICIA          Emelda Fear, RN, PATRICIA                              C 07/30/15 10:49:45             C 07/30/15 10:49:45             C 07/30/15 10:49:45                                Entry 4                         Entry 5                         Entry 6                                          Case Attendee             Jacquiline Doe,  Earlene Plater                              Christopher-CRNA                J-MD    Role Performed            CRNA                            Orientee                        Surgical Scrub    Time In                   07/30/15 10:10:00               07/30/15 10:10:00               07/30/15 10:10:00    Time Out  07/30/15 10:32:00               07/30/15 10:32:00               07/30/15 10:32:00    Procedure                 Hysteroscopy with or            Hysteroscopy with or            Hysteroscopy with or                              without Dilatation              without Dilatation              without Dilatation    Last Modified By:         Emelda Fear RN, Dorene Sorrow, RN, PATRICIA          Emelda Fear, RN, PATRICIA                              C 07/30/15 10:49:45             C 07/30/15 10:49:45             C 07/30/15 10:49:45                                Entry 7                                                                                                          Case Attendee             Jacques Earthly L.-MD    Role Performed            Anesthesiologist    Time In                   07/30/15 10:10:00    Time Out                  07/30/15 10:32:00    Procedure                 Hysteroscopy with or                              without Dilatation    Last Modified By:         Emelda Fear RN, PATRICIA                              C 07/30/15 10:49:45    General Comments:            [facility] [facility]      [facility] -  Case Attendance Audit                                                                     07/30/15 10:49:45         Owner: [email or web address]                               Modifier: [email or web address]                                                            1     <*> Time Out            1     <*> Procedure            2     <*> Time Out            2     <*> Procedure            2     <*> Procedure                              Hysteroscopy with or without  Dilatation            2     <*> Procedure                              Hysteroscopy with or without Dilatation            3     <*> Time Out            3     <*> Procedure            3     <*> Procedure                              Hysteroscopy with or without Dilatation            3     <*> Procedure                              Hysteroscopy with or without Dilatation            4     <*> Time Out            4     <*> Procedure            4     <*> Procedure                              Hysteroscopy with or without Dilatation            4     <*> Procedure  Hysteroscopy with or without Dilatation            5     <*> Time Out            5     <*> Procedure            5     <*> Procedure                              Hysteroscopy with or without Dilatation            5     <*> Procedure                              Hysteroscopy with or without Dilatation            6     <*> Time Out            6     <*> Procedure            6     <*> Procedure                              Hysteroscopy with or without Dilatation            6     <*> Procedure                              Hysteroscopy with or without Dilatation            7     <*> Time In            7     <*> Time Out            7     <*> Procedure            7     <*> Procedure                              Hysteroscopy with or without Dilatation            7     <*> Procedure                              Hysteroscopy with or without Dilatation     07/30/15 10:37:52         Owner: [email or web address]                               Modifier: [email or web address]                                                        Entry -1 was deleted.  Higher numbered entries shifted one position to fill the gap.        <-> -1        Case Attendee  Brandi Fritz        <-> -1        Role Performed                         Surgical Scrub            1     <*> Case Attendee                          Vickey Huger G.-MD            1     <*> Case Attendee                           FERGUSON, RN, PATRICIA C            1     <*> Case Attendee                          Vickey Huger G.-MD            1     <*> Case Attendee                          Vickey Huger G.-MD            1     <*> Role Performed                         Surgeon Primary            1     <*> Role Performed                         Surgeon Primary            1     <*> Role Performed                         Circulator            1     <*> Time In                                07/30/15 10:10:00            1     <*> Time In                                07/30/15 10:10:00            2     <*> Case Attendee                          [facility], RN, ELIZABETH S            2     <*> Case Attendee                          FERGUSON, RN, PATRICIA C            2     <*> Case Attendee  FERGUSON, RN, PATRICIA C            2     <*> Role Performed                         Circulator            2     <*> Role Performed                         Observer            2     <*> Time In                                07/30/15 10:10:00            2     <*> Time In                                07/30/15 10:10:00            2     <*> Procedure                              Hysteroscopy with or without Dilatation            2     <*> Procedure                              Hysteroscopy with or without Dilatation            3     <*> Case Attendee                          Adron Bene,  Christopher-CRNA            3     <*> Case Attendee                          [facility], RN, ELIZABETH S            3     <*> Case Attendee                          [facility], RN, ELIZABETH S            3     <*> Role Performed                         Observer            3     <*> Role Performed                         CRNA            3     <*> Time In                                07/30/15 10:10:00            3     <*>  Time In                                07/30/15 10:10:00            3     <*> Procedure                               Hysteroscopy with or without Dilatation            3     <*> Procedure                              Hysteroscopy with or without Dilatation            4     <*> Case Attendee                          Delsa Sale J-MD            4     <*> Case Attendee                          Adron Bene,  Christopher-CRNA            4     <*> Case Attendee                          Adron Bene,  Christopher-CRNA            4     <*> Role Performed                         CRNA            4     <*> Role Performed                         Orientee            4     <*> Time In                                07/30/15 10:10:00            4     <*> Time In                                07/30/15 10:10:00            4     <*> Procedure                              Hysteroscopy with or without Dilatation            4     <*> Procedure                              Hysteroscopy with or without Dilatation            5     <*> Case Attendee  Brandi Fritz            5     <*> Case Attendee                          Delsa Sale J-MD            5     <*> Case Attendee                          Delsa Sale J-MD            5     <*> Role Performed                         Orientee            5     <*> Role Performed                         Surgical Scrub            5     <*> Time In                                07/30/15 10:10:00            5     <*> Time In                                07/30/15 10:10:00            5     <*> Procedure                              Hysteroscopy with or without Dilatation            5     <*> Procedure                              Hysteroscopy with or without Dilatation            6     <*> Case Attendee                          Brandi Fritz            6     <*> Case Attendee                          Brandi Fritz            6     <*> Role Performed                         Surgical Scrub            6     <*> Time In                                 07/30/15 10:10:00  6     <*> Time In                                07/30/15 10:10:00            6     <*> Procedure                              Hysteroscopy with or without Dilatation            6     <*> Procedure                              Hysteroscopy with or without Dilatation            7     <*> Case Attendee                          Jacques Earthly L.-MD            7     <*> Role Performed        Entry 7 was deleted.  Higher numbered entries shifted one position to fill the gap.        <-> 7         Case Attendee                          Brandi Fritz        <-> 7         Role Performed                         Surgical Scrub        <-> 7         Procedure     07/30/15 10:35:17         Owner: FERGPA                               Modifier: [email or web address]                                                        Entry -1 was deleted.  Higher numbered entries shifted one position to fill the gap.        <-> -1        Time In                                07/30/15 10:10:00        <-> -1        Procedure                              Hysteroscopy with or without Dilatation            1     <*> Case Attendee  Vickey Huger G.-MD            1     <*> Case Attendee                          FERGUSON, RN, PATRICIA C            1     <*> Case Attendee                          Vickey Huger G.-MD            1     <*> Role Performed                         Surgeon Primary            1     <*> Role Performed                         Circulator            1     <*> Time In                                07/30/15 10:10:00            2     <*> Case Attendee                          [facility], RN, ELIZABETH S            2     <*> Case Attendee                          FERGUSON, RN, PATRICIA C            2     <*> Role Performed                         Observer            2     <*> Time In                                07/30/15 10:10:00            2     <*> Procedure                               Hysteroscopy with or without Dilatation            2     <*> Procedure                              Hysteroscopy with or without Dilatation            3     <*> Case Attendee                          Hensley,  Christopher-CRNA            3     <*>  Case Attendee                          [facility], RN, ELIZABETH S            3     <*> Role Performed                         CRNA            3     <*> Time In                                07/30/15 10:10:00            3     <*> Procedure                              Hysteroscopy with or without Dilatation            3     <*> Procedure                              Hysteroscopy with or without Dilatation            4     <*> Case Attendee                          Delsa Sale J-MD            4     <*> Case Attendee                          Adron Bene,  Christopher-CRNA            4     <*> Role Performed                         Orientee            4     <*> Time In                                07/30/15 10:10:00            4     <*> Procedure                              Hysteroscopy with or without Dilatation            4     <*> Procedure                              Hysteroscopy with or without Dilatation            5     <*> Case Attendee                          Jacques Earthly L.-MD            5     <*> Case Attendee  Delsa Sale J-MD            5     <*> Role Performed                         Anesthesiologist            5     <*> Time In                                07/30/15 10:10:00            5     <*> Procedure                              Hysteroscopy with or without Dilatation            5     <*> Procedure                              Hysteroscopy with or without Dilatation            6     <*> Case Attendee                          Jacques Earthly L.-MD            6     <+> Role Performed            6     <*> Time In                                07/30/15 10:10:00            6     <*> Procedure                               Hysteroscopy with or without Dilatation            6     <*> Procedure                              Hysteroscopy with or without Dilatation     07/30/15 10:31:14         Owner: FERGPA                               Modifier: [email or web address]                                                            2     <*> Procedure                              Hysteroscopy with or without Dilatation            2     <*> Procedure  Hysteroscopy with or without Dilatation            3     <*> Procedure                              Hysteroscopy with or without Dilatation            3     <*> Procedure                              Hysteroscopy with or without Dilatation            4     <*> Procedure                              Hysteroscopy with or without Dilatation            4     <*> Procedure                              Hysteroscopy with or without Dilatation            5     <*> Procedure                              Hysteroscopy with or without Dilatation            5     <*> Procedure                              Hysteroscopy with or without Dilatation            6     <+> Time In            6     <*> Procedure                              Hysteroscopy with or without Dilatation            7     <+> Case Attendee            7     <+> Role Performed            7     <*> Procedure                              Hysteroscopy with or without Dilatation     07/30/15 10:28:43         Owner: FERGPA                               Modifier: FERGPA                                                            2     <+> Time In  2     <*> Procedure                              Hysteroscopy with or without Dilatation            2     <*> Procedure                              Hysteroscopy with or without Dilatation            3     <+> Time In            3     <*> Procedure                              Hysteroscopy with or without Dilatation            3     <*> Procedure                               Hysteroscopy with or without Dilatation            4     <+> Time In            4     <*> Procedure                              Hysteroscopy with or without Dilatation            4     <*> Procedure                              Hysteroscopy with or without Dilatation            5     <+> Time In            5     <*> Procedure                              Hysteroscopy with or without Dilatation            5     <*> Procedure                              Hysteroscopy with or without Dilatation        <+> 7         Case Attendee        <+> 7         Role Performed        <+> 7         Procedure     07/30/15 10:25:05         Owner: [email or web address]                               Modifier: [email or web address]  1     <*> Time In            2     <*> Case Attendee                          FERGUSON, RN, PATRICIA C            2     <*> Role Performed            2     <*> Procedure            3     <*> Case Attendee                          [facility], RN, ELIZABETH S            3     <*> Role Performed            3     <*> Procedure            4     <*> Case Attendee                          Hensley,  Christopher-CRNA            4     <*> Role Performed            4     <*> Procedure            5     <*> Case Attendee                          Delsa Sale J-MD            5     <*> Role Performed            5     <*> Procedure        SFOR - Skin Assessment                                                                          Pre-Care Text:            A.240 Assesses baseline skin condition Im.120 Implements protective measures to prevent skin or tissue injury           due to mechanical sources  Im.280.1 Implements progective measures to prevent skin or tissue injury due to           thermal sources Im.360 Monitors for signs and symptons of infection                              Entry 1  Skin Integrity            Intact    Last Modified By:         Emelda Fear, RN, PATRICIA                              C 07/30/15 10:25:10    Post-Care Text:            E.10 Evaluates for signs and symptoms of physical injury to skin and tissue E.270 Evaluate tissue perfusion           O.60 Patient is free from signs and symptoms of injury caused by extraneous objects   O.210 Patinet's tissue           perfusion is consistent with or improved from baseline levels      SFOR - Patient Positioning                                                                      Pre-Care Text:            A.240 Assesses baseline skin condition A.280 Identifies baseline musculoskeletal status A.280.1 Identifies           physical alterations that require additional precautions for procedure-specific positioning A.510.8 Maintains           patient's dignity and privacy Im.120 Implements protective measures to prevent skin/tissue injury due to           mechanical sources Im.40 Positions the patient Im.80 Applies safety devices                              Entry 1                                                                                                          Procedure                 Hysteroscopy with or            Body Position                   Lithotomy                              without Dilatation    Left Arm Position         Extended on Padded Arm          Right Arm Position              Extended on Padded Arm  Board w/Security Strap                                          Board w/Security Strap    Left Leg Position         Stirrup [employer]              Right Leg Position              Stirrup Candy Cane                              Secured In                                                      Secured In    Feet Uncrossed            Yes                             Pressure Points                 Yes                                                              Checked     Positioning Device         Pillow                          Positioned By                   [employer], RN, PATRICIA                                                                                              [employer] L.-MD    Outcome Met (O.80)        Yes    Last Modified By:         Emelda Fear, RN, PATRICIA                              C 07/30/15 10:57:42    Post-Care Text:            E.10 Evaluates for signs and symptoms of physical injury to skin and tissue E.290 Evaluates musculoskeletal           status O.80 Patient is free from signs and symptoms of injury related to positioning O.120 the patient is free  from signs and symptoms of injury related to transfer/transport  O.250 Patient's musculoskeletal status is           maintained at or improved from baseline levels      [facility] - Patient Positioning Audit                                                                 07/30/15 10:57:42         Owner: [email or web address]                               Modifier: [email or web address]                                                            1     <*> Body Position                          Lithotomy            1     <*> Right Arm Position                     Extended on Padded Arm Board w/Security Strap            1     <*> Left Arm Position                      Extended on Padded Arm Board w/Security Strap            1     <*> Right Leg Position                     Stirrup Candy Cane Secured In            1     <*> Left Leg Position                      Stirrup Candy Cane Secured In            1     <*> Feet Uncrossed                         Yes            1     <*> Pressure Points Checked                Yes            1     <*> Positioned By                          Jacques Earthly L.-MD            1     <*> Positioning Device                     Pillow  1     <*> Procedure                              Hysteroscopy with or without Dilatation            1     <*> Outcome Met (O.80)                     Yes        Entry 2 was deleted.   Higher numbered entries shifted one position to fill the gap.        <-> 2         Positioned By                          Vickey Huger G.-MD        <-> 2         Positioning Device                     Pillow, Foam Padding        <-> 2         Procedure                              Hysteroscopy with or without Dilatation        <-> 2         Outcome Met (O.80)                     Yes     07/30/15 10:57:01         Owner: [email or web address]                               Modifier: [email or web address]                                                            1     <*> Body Position                          Lithotomy            1     <*> Right Arm Position                     Extended on Padded Arm Board w/Security Strap            1     <*> Left Arm Position                      Extended on Padded Arm Board w/Security Strap            1     <*> Right Leg Position                     Stirrup Candy Cane Secured In            1     <*> Left Leg Position  Stirrup [employer] In            1     <*> Feet Uncrossed                         Yes            1     <*> Pressure Points Checked                Yes            1     <*> Positioned By                          Jacques Earthly L.-MD            1     <*> Positioning Device                     Pillow            1     <*> Procedure                              Hysteroscopy with or without Dilatation            1     <*> Outcome Met (O.80)                     Yes        Entry 2 was deleted.  Higher numbered entries shifted one position to fill the gap.        <-> 2         Positioned By                          Vickey Huger G.-MD        <-> 2         Positioning Device                     Foam Padding, Pillow        <-> 2         Procedure                              Hysteroscopy with or without Dilatation        <-> 2         Outcome Met (O.80)     07/30/15 10:39:07         Owner: [email or web address]                               Modifier: FERGPA                                                             1     <*> Body Position                          Lithotomy  1     <*> Right Arm Position                     Extended on Padded Arm Board w/Security Strap            1     <*> Left Arm Position                      Extended on Padded Arm Board w/Security Strap            1     <*> Right Leg Position                     Stirrup Candy Cane Secured In            1     <*> Left Leg Position                      Stirrup Candy Cane Secured In            1     <*> Feet Uncrossed                         Yes            1     <*> Pressure Points Checked                Yes            1     <*> Positioned By                          Jacques Earthly L.-MD            1     <*> Positioning Device                     Pillow            1     <*> Procedure                              Hysteroscopy with or without Dilatation            1     <*> Outcome Met (O.80)                     Yes            2     <*> Positioned By                          Vickey Huger G.-MD        Entry 2 was deleted.  Higher numbered entries shifted one position to fill the gap.        <-> 2         Positioned By                          Emelda Fear, RN, PATRICIA C        <-> 2         Positioning Device                     Pillow        <->  2         Procedure                              Hysteroscopy with or without Dilatation        <-> 2         Outcome Met (O.80)                     Yes     07/30/15 10:36:58         Owner: [email or web address]                               Modifier: FERGPA                                                        <+> 3         Positioned By        <+> 3         Positioning Device        <+> 3         Procedure     07/30/15 10:33:49         Owner: [email or web address]                               Modifier: FERGPA                                                            1     <*> Positioned By                          Adron Bene,  Christopher-CRNA        Entry 1 was deleted.  Higher numbered entries shifted one position to  fill the gap.        <-> 1         Body Position                          Lithotomy        <-> 1         Right Arm Position                     Extended on Padded Arm Board w/Security Strap        <-> 1         Left Arm Position                      Extended on Padded Arm Board w/Security Strap        <-> 1         Right Leg Position                     Stirrup [employer] In        <-> 1  Left Leg Position                      Stirrup [employer] In        <-> 1         Feet Uncrossed                         Fritz/a        <-> 1         Pressure Points Checked                Yes        <-> 1         Positioned By                          [employer],  Christopher-CRNA        <-> 1         Positioning Device                     Pillow, Pillow        <-> 1         Procedure                              Hysteroscopy with or without Dilatation        <-> 1         Outcome Met (O.80)                     Yes            2     <-> Body Position                          Lithotomy            2     <-> Right Arm Position                     Extended on Padded Arm Board w/Security Strap            2     <-> Left Arm Position                      Extended on Padded Arm Board w/Security Strap            2     <-> Right Leg Position                     Stirrup [employer] Secured In            2     <-> Left Leg Position                      Stirrup Candy Cane Secured In            2     <-> Feet Uncrossed                         Yes            2     <-> Pressure Points Checked  Yes            2     <*> Positioning Device                     Pillow            2     <*> Procedure                              Hysteroscopy with or without Dilatation            2     <*> Outcome Met (O.80)                     Yes     07/30/15 10:29:26         Owner: [email or web address]                               Modifier: FERGPA                                                        <+> 2         Body Position        <+> 2          Right Arm Position        <+> 2         Left Arm Position        <+> 2         Right Leg Position        <+> 2         Left Leg Position        <+> 2         Feet Uncrossed        <+> 2         Pressure Points Checked        <+> 2         Positioned By        <+> 2         Positioning Device        <+> 2         Procedure        <+> 2         Outcome Met (O.80)        <+> 3         Positioned By        <+> 3         Positioning Device        <+> 3         Procedure        <+> 3         Outcome Met (O.80)        [facility] - Skin Prep  Pre-Care Text:            A.30 Verifies allergies A.20 Verifies procedure, surgical site, and laterality A.510.8 Maintains paritnet's           dignity and privacy Im.270 Performs Skin Preparation Im.270.1 Implements protective measures to prevent skin           and tissue injury due to chemical sources  A.300.1 Protects from cross-contamination                              Entry 1                                                                                                          Hair Removal     Skin Prep      Prep Agents (Im.270)     Povidone Iodine                 Prep Area (Im.270)              Labia/Perineum/Urethra,                              Solution 10%                                                    Vagina, Vagina and                                                                                              Perineum     Prep By                  Vickey Huger G.-MD    Outcome Met (O.100)       Yes    Last Modified By:         Emelda Fear, RN, PATRICIA                              C 07/30/15 10:39:41    Post-Care Text:            E.10 Evaluates for signs and symptoms of physical injury to skin and tissue O.100 Patient is free from signs           and symptoms of chemical injury  O.740 The patient's right to privacy is maintained      [facility] - Skin Prep Audit  07/30/15 10:39:41         Owner: CZYSAY                               Modifier: FERGPA                                                            1     <*> Prep Area (Im.270)                     Labia/Perineum/Urethra, Vagina, Vagina and Perineum            1     <*> Prep Agents (Im.270)                   Povidone Iodine Solution 10%            1     <*> Prep By                                Vickey Huger G.-MD            1     <*> Outcome Met (O.100)                    Yes        Entry 2 was deleted.  Higher numbered entries shifted one position to fill the gap.        <-> 2         Outcome Met (O.100)                    Yes     07/30/15 10:33:58         Owner: FERGPA                               Modifier: FERGPA                                                        <+> 2         Outcome Met (O.100)        [facility] - Counts Initial and Final                                                                 Pre-Care Text:            A.20 Verifies operative procedure, sugical site, and laterality A.20.2 Assesses the risk for unintended           retained foreign body Im.20 Performs required counts  Entry 1                                                                                                          Initial Counts      Initial Counts           FERGUSON, RN, PATRICIA          Items included in               Sponges     Performed By             Dannette Barbara,  SHAQUETTA Fritz          the Initial Count     Final Counts      Final Counts             FERGUSON, RN, PATRICIA          Final Count Status              Correct     Performed By             Dannette Barbara,  Myra Rude Fritz     Items Included in        Sponges     Final Count     Outcome Met (O.20)        Yes    Last Modified By:         Emelda Fear, RN, PATRICIA                              C 07/30/15 10:41:25    Post-Care Text:            E.50 Evaluates results of the surgical count O.20 Patient is free from  unintended retained foreign objects      [facility] - Counts Initial and Final Audit                                                            07/30/15 10:41:25         Owner: [email or web address]                               Modifier: FERGPA                                                            1     <*> Initial Counts Performed By            Emelda Fear, RN, PATRICIA C            1     <+>  Items included in the Initial Count            1     <+> Final Counts Performed By            1     <+> Final Count Status            1     <+> Items Included in Final Count            1     <+> Outcome Met (O.20)        [facility] - General Case Data                                                                        Pre-Care Text:            A.350.1 Classifies surgical wound                              Entry 1                                                                                                          Case Information      ASA Class                2                               Case Level                      Level 3     OR                       SF 04                           Specialty                       Gynecology and                                                                                              Obstetrics (SN)     Wound Class              2-Clean-Contaminated  Preop Diagnosis           PMB    Last Modified By:         Emelda Fear, RN, PATRICIA                              C 07/30/15 10:42:03    Post-Care Text:            O.760 Patient receives consistent and comparable care regardless of the setting      [facility] - Fire Risk Assessment                                                                                               Entry 1                                                                                                          Fire Risk                 Fritz/A                             Fire Risk Score                 0    Assessment: If     checked, checkmark     = 1 point     Last Modified By:          Emelda Fear, RN, PATRICIA                              C 07/30/15 10:43:36      [facility] - Safety Checklist - Time Out                                                              Pre-Care Text:            A.10 Confirms patient identity A.20 Verifies operative procedure, surgical site, and laterality A.20.1 Verifies           consent for planned procedure A.30 Verifies allergies                              Entry 1  Surgical/Procedure        Yes                             Time Out Complete               07/30/15 10:17:00    Team confirms     correct patient,     correct site and     correct procedure     Last Modified By:         Emelda Fear RN, PATRICIA                              C 07/30/15 10:43:47    Post-Care Text:            E.30 Evaluates verification process for correct patient, site, side, and level surgery      [facility] - Patient Care Devices                                                                     Pre-Care Text:            A.200 Assesses risk for normothermia regulation A.40 Verifies presence of prosthetics or corrective devices           Im.280 Implements thermoregulation measures Im.60 Uses supplies and equipment within safe parameters                              Entry 1                                                                                                          Equipment Type            MACHINE SEQUENTIAL              SCD Sleeve Site                 Legs Bilateral                              COMPRESSION    Equipment/Tag Number      [id number]                           Initiated Pre                   Yes  Induction     Last Modified By:         Emelda Fear, RN, PATRICIA                              C 07/30/15 10:45:49    Post-Care Text:            E.10 Evaluates signs and symptoms of physical injury to skin and tissue O.60 Patient is  free from signs and           symptoms of injury caused by extraneous objects      [facility] - Patient Care Devices Audit                                                                07/30/15 10:45:49         Owner: EAVWUJ                               Modifier: FERGPA                                                            1     <*> Equipment Type                         MACHINE SEQUENTIAL COMPRESSION            1     <+> Equipment/Tag Number        [facility] - Surgical Fluid Management                                                                Pre-Care Text:            A.280 Verifies allergies A.310 Identifies factors associated with an increased risk for hemorrhage or fluid and           electrolyte imbalance Im.210 Administers prescribed solutions A.280.1 Implements protective measures to prevent           skin or tissue injury due to thermal sources                              Entry 1  Irrigant                  0.9% Normal Saline              Irrigant Volume In              400 mL    Irrigant Volume Out       400 mL                          Outcome Met (O.300)             Yes    Last Modified By:         Emelda Fear, RN, PATRICIA                              C 07/30/15 10:46:57    Post-Care Text:            E.10 Evaluates for signs and symptoms of physical injury to skin and tissue O.10 Patient is free from signs and           symptoms of injury due to thermal sources O.100 Patient is free from signs and symptoms of chemical injury      [facility] - Specimens                                                                                                          Entry 1                                                                                                          Description               ENDOMETRIAL CURETTING           Specimen Type                   Routine                              WITH ENDOMETRIAL   POLYP    Last Modified By:         Emelda Fear, RN, PATRICIA                              C 07/30/15 10:47:50      [facility] - Dressing/Packing  Pre-Care Text:            A.350 Assesses susceptibility for infection Im.250 Administers care to invasive devices Im.290 Administer care           to wound sites  Im.300 Implements aseptic technique                              Entry 1                                                                                                          Site                      Perineum    Dressing Item     Details      Dressing Item            Peripad     (Im.290)     Last Modified By:         Emelda Fear, RN, PATRICIA                              C 07/30/15 10:49:16    Post-Care Text:            E.320 Evaluate factors associted with increased risk for postoperative infection at the completion of the           procedure O.200 Patient's wound perfusion is consistent with or improved from baseline levels  O.Patient is           free from signs and symptoms of infection      [facility] - Procedures                                                                               Pre-Care Text:            A.20 Verifies operative procedure, surgical site, and laterality Im.150 Develops individualized plan of care                              Entry 1  Procedure     Description      Procedure                Hysteroscopy with or            Surgical Procedure              HYSTEROSCOPY D&C                              without Dilatation and          Text                               Curettage    Primary Procedure         Yes                             Primary Surgeon                 Vickey Huger G.-MD    Start                     07/30/15 10:18:00               Stop                            07/30/15 10:29:00    Anesthesia Type            General                         Surgical Service                Gynecology and                                                                                              Obstetrics (SN)    Wound Class               2-Clean-Contaminated    Last Modified By:         Emelda Fear RN, PATRICIA                              C 07/30/15 10:49:44    Post-Care Text:            O.730 The patient's care is consistent with the individualized perioperative plan of care      [facility] - Safety Checklist - Sign Out                                                              Pre-Care Text:  Im.330 Manages specimen handling and disposition                              Entry 1                                                                                                          Patient Safety            Yes    Communication Guide     Used Throughout Case     Last Modified By:         Emelda Fear, RN, PATRICIA                              C 07/30/15 10:50:45    Post-Care Text:            E.800 Ensures continuity of care E.50 Evaluates results of the surgical count O.30 Patient's procedure is           performed on the correct site, side, and level O.50 patient's current status is communicated throughout the           continuum of care O.40 Patient's specimen(s) is managed in the appropriate manner      [facility] - Transfer                                                                                                           Entry 1                                                                                                          Transferred By            Adron Bene,                        Via                             [employer]  Christopher-CRNA,                              Delsa Sale                              J-MD    Post-op Destination       PACU    Skin Assessment      Condition                Intact    Last Modified By:         Emelda Fear, RN, PATRICIA                              C  07/30/15 10:51:22      Case Comments                                                                                         <None>              Finalized By: Emelda Fear RN, PATRICIA C      Document Signatures                                                                             Signed By:           Emelda Fear RN, PATRICIA C 07/30/15 10:58

## 2015-07-30 NOTE — Discharge Summary (Signed)
Depart Summary             [facility]  Post-Acute Care Transfer Instructions  PERSON INFORMATION   Name: Brandi Fritz, Brandi Fritz  MRN: [id number]    FIN#: [id number]   PHYSICIANS  Admitting Physician: Vickey Huger G.-MD  Attending Physician: Reece Packer-MD   PCP: Luis Abed D.-MD  Discharge Diagnosis:  Post menopausal bleeding  Comment:       PATIENT EDUCATION INFORMATION  Instructions:             D and C; Anesthesia: After Your Surgery; Hysteroscopy  Medication Leaflets:               Follow-up:                          With: Address: When:   Vickey Huger  Within 1 to 2 weeks       With: Address: When:   [facility] [address]  [place], [place]  [address]  [phone] Business (1)                            MEDICATION LIST  Medication Reconciliation at Discharge:         Medications that have not changed  Other Medications  clonazePAM (KlonoPIN 0.5 mg oral tablet) 1 Tabs Oral (given by mouth) 2 times a day as needed., SOUND ALIKE / LOOK ALIKE - VERIFY DRUG   SOUND ALIKE / LOOK ALIKE - VERIFY DRUG  Last Dose:____________________  cyanocobalamin (Vitamin B12) 1 Tabs Oral (given by mouth) every day.  Last Dose:____________________  cyclobenzaprine (Flexeril 10 mg oral tablet) 1 Tabs Oral (given by mouth) 3 times a day as needed., " THIS MEDICATION IS ASSOCIATED WITH AN INCREASED RISK OF FALLS."  Last Dose:____________________  ergocalciferol (Vitamin D) 400 Milligram Oral (given by mouth) every day.  Last Dose:____________________  gabapentin (gabapentin 300 mg oral capsule) 2 Capsules Oral (given by mouth) Once a Day (at bedtime).  Last Dose:____________________  levothyroxine 0.088 Milligram Oral (given by mouth) once a day (in the morning).  Last Dose:____________________  lutein (Lutein) 1 Capsules Oral (given by mouth) every day.  Taking For and suppl Fibromalic  Last Dose:____________________  multivitamin with minerals (Centrum) 1 Tabs Oral (given by mouth)  Monday/Wednesday/Friday.  Last Dose:____________________  norethindrone (norethindrone 5 mg oral tablet) 1 Tabs Oral (given by mouth) Once a Day (at bedtime).  Last Dose:____________________         Patient's Final Home Medication List Upon Discharge:          clonazePAM (KlonoPIN 0.5 mg oral tablet) 1 Tabs Oral (given by mouth) 2 times a day as needed., SOUND ALIKE / LOOK ALIKE - VERIFY DRUG   SOUND ALIKE / LOOK ALIKE - VERIFY DRUG  cyanocobalamin (Vitamin B12) 1 Tabs Oral (given by mouth) every day.  cyclobenzaprine (Flexeril 10 mg oral tablet) 1 Tabs Oral (given by mouth) 3 times a day as needed., " THIS MEDICATION IS ASSOCIATED WITH AN INCREASED RISK OF FALLS."  ergocalciferol (Vitamin D) 400 Milligram Oral (given by mouth) every day.  gabapentin (gabapentin 300 mg oral capsule) 2 Capsules Oral (given by mouth) Once a Day (at bedtime).  levothyroxine 0.088 Milligram Oral (given by mouth) once a day (in the morning).  lutein (Lutein) 1 Capsules Oral (given by mouth) every day.  Taking For and suppl Fibromalic  multivitamin  with minerals (Centrum) 1 Tabs Oral (given by mouth) Monday/Wednesday/Friday.  norethindrone (norethindrone 5 mg oral tablet) 1 Tabs Oral (given by mouth) Once a Day (at bedtime).         Comment:       ORDERS         Order Name Order Details   Discharge Patient 07/30/15 10:41:00 EDT

## 2015-07-30 NOTE — Discharge Summary (Signed)
Depart Summary             [facility]  Post-Acute Care Transfer Instructions  PERSON INFORMATION   Name: Brandi Fritz, Brandi Fritz  MRN: [id number]    FIN#: [id number]   PHYSICIANS  Admitting Physician: Vickey Huger G.-MD  Attending Physician: Reece Packer-MD   PCP: Luis Abed D.-MD  Discharge Diagnosis:  Post menopausal bleeding  Comment:       PATIENT EDUCATION INFORMATION  Instructions:             Hysteroscopy  Medication Leaflets:               Follow-up:                          With: Address: When:   Vickey Huger  Within 1 to 2 weeks       With: Address: When:   [facility] [address] [place]  [place], [place]  [address]  [phone] Business (1)                            MEDICATION LIST  Medication Reconciliation at Discharge:         Medications that have not changed  Other Medications  clonazePAM (KlonoPIN 0.5 mg oral tablet) 1 Tabs Oral (given by mouth) 2 times a day as needed., SOUND ALIKE / LOOK ALIKE - VERIFY DRUG   SOUND ALIKE / LOOK ALIKE - VERIFY DRUG  Last Dose:____________________  cyanocobalamin (Vitamin B12) 1 Tabs Oral (given by mouth) every day.  Last Dose:____________________  cyclobenzaprine (Flexeril 10 mg oral tablet) 1 Tabs Oral (given by mouth) 3 times a day as needed., " THIS MEDICATION IS ASSOCIATED WITH AN INCREASED RISK OF FALLS."  Last Dose:____________________  ergocalciferol (Vitamin D) 400 Milligram Oral (given by mouth) every day.  Last Dose:____________________  gabapentin (gabapentin 300 mg oral capsule) 2 Capsules Oral (given by mouth) Once a Day (at bedtime).  Last Dose:____________________  levothyroxine 0.088 Milligram Oral (given by mouth) once a day (in the morning).  Last Dose:____________________  lutein (Lutein) 1 Capsules Oral (given by mouth) every day.  Taking For and suppl Fibromalic  Last Dose:____________________  multivitamin with minerals (Centrum) 1 Tabs Oral (given by mouth) Monday/Wednesday/Friday.  Last  Dose:____________________  norethindrone (norethindrone 5 mg oral tablet) 1 Tabs Oral (given by mouth) Once a Day (at bedtime).  Last Dose:____________________         Patient???s Final Home Medication List Upon Discharge:          clonazePAM (KlonoPIN 0.5 mg oral tablet) 1 Tabs Oral (given by mouth) 2 times a day as needed., SOUND ALIKE / LOOK ALIKE - VERIFY DRUG   SOUND ALIKE / LOOK ALIKE - VERIFY DRUG  cyanocobalamin (Vitamin B12) 1 Tabs Oral (given by mouth) every day.  cyclobenzaprine (Flexeril 10 mg oral tablet) 1 Tabs Oral (given by mouth) 3 times a day as needed., " THIS MEDICATION IS ASSOCIATED WITH AN INCREASED RISK OF FALLS."  ergocalciferol (Vitamin D) 400 Milligram Oral (given by mouth) every day.  gabapentin (gabapentin 300 mg oral capsule) 2 Capsules Oral (given by mouth) Once a Day (at bedtime).  levothyroxine 0.088 Milligram Oral (given by mouth) once a day (in the morning).  lutein (Lutein) 1 Capsules Oral (given by mouth) every day.  Taking For and suppl Fibromalic  multivitamin with minerals (Centrum) 1 Tabs Oral (given  by mouth) Monday/Wednesday/Friday.  norethindrone (norethindrone 5 mg oral tablet) 1 Tabs Oral (given by mouth) Once a Day (at bedtime).         Comment:       ORDERS         Order Name Order Details

## 2015-07-30 NOTE — Discharge Summary (Signed)
Depart Summary             [facility]  Post-Acute Care Transfer Instructions  PERSON INFORMATION   Name: Brandi Fritz, Brandi Fritz  MRN: [id number]    FIN#: [id number]   PHYSICIANS  Admitting Physician: Vickey Huger G.-MD  Attending Physician: Reece Packer-MD   PCP: Luis Abed D.-MD  Discharge Diagnosis:    Comment:       PATIENT EDUCATION INFORMATION  Instructions:             Hysteroscopy  Medication Leaflets:               Follow-up:                          With: Address: When:   Vickey Huger  Within 1 to 2 weeks       With: Address: When:   [facility] [address] [place]  [place], [place]  [address]  [phone] Business (1)                            MEDICATION LIST  Medication Reconciliation at Discharge:         Medications that have not changed  Other Medications  clonazePAM (KlonoPIN 0.5 mg oral tablet) 1 Tabs Oral (given by mouth) 2 times a day as needed., SOUND ALIKE / LOOK ALIKE - VERIFY DRUG   SOUND ALIKE / LOOK ALIKE - VERIFY DRUG  Last Dose:____________________  cyanocobalamin (Vitamin B12) 1 Tabs Oral (given by mouth) every day.  Last Dose:____________________  cyclobenzaprine (Flexeril 10 mg oral tablet) 1 Tabs Oral (given by mouth) 3 times a day as needed., " THIS MEDICATION IS ASSOCIATED WITH AN INCREASED RISK OF FALLS."  Last Dose:____________________  ergocalciferol (Vitamin D) 400 Milligram Oral (given by mouth) every day.  Last Dose:____________________  gabapentin (gabapentin 300 mg oral capsule) 2 Capsules Oral (given by mouth) Once a Day (at bedtime).  Last Dose:____________________  levothyroxine 0.088 Milligram Oral (given by mouth) once a day (in the morning).  Last Dose:____________________  lutein (Lutein) 1 Capsules Oral (given by mouth) every day.  Taking For and suppl Fibromalic  Last Dose:____________________  multivitamin with minerals (Centrum) 1 Tabs Oral (given by mouth) Monday/Wednesday/Friday.  Last Dose:____________________  norethindrone  (norethindrone 5 mg oral tablet) 1 Tabs Oral (given by mouth) Once a Day (at bedtime).  Last Dose:____________________         Patient???s Final Home Medication List Upon Discharge:          clonazePAM (KlonoPIN 0.5 mg oral tablet) 1 Tabs Oral (given by mouth) 2 times a day as needed., SOUND ALIKE / LOOK ALIKE - VERIFY DRUG   SOUND ALIKE / LOOK ALIKE - VERIFY DRUG  cyanocobalamin (Vitamin B12) 1 Tabs Oral (given by mouth) every day.  cyclobenzaprine (Flexeril 10 mg oral tablet) 1 Tabs Oral (given by mouth) 3 times a day as needed., " THIS MEDICATION IS ASSOCIATED WITH AN INCREASED RISK OF FALLS."  ergocalciferol (Vitamin D) 400 Milligram Oral (given by mouth) every day.  gabapentin (gabapentin 300 mg oral capsule) 2 Capsules Oral (given by mouth) Once a Day (at bedtime).  levothyroxine 0.088 Milligram Oral (given by mouth) once a day (in the morning).  lutein (Lutein) 1 Capsules Oral (given by mouth) every day.  Taking For and suppl Fibromalic  multivitamin with minerals (Centrum) 1 Tabs Oral (given by mouth)  Monday/Wednesday/Friday.  norethindrone (norethindrone 5 mg oral tablet) 1 Tabs Oral (given by mouth) Once a Day (at bedtime).         Comment:       ORDERS         Order Name Order Details

## 2015-07-30 NOTE — Care Plan (Signed)
ED Pat Edu       ;        [facility]  [address]  [place], [place] [id number]  [phone]  Patient Discharge Instructions     Name: Brandi Fritz, Brandi Fritz  Current Date: 07/30/2015 09:57:56  DOB: January 07, 1956 MRN: [id number] FIN: [id number]  [address] Address[address] [facility] [address]  Patient Phone: [phone]  Primary Care Provider:  Name: Luis AbedHaskins,  Curtis D.-MD  Phone: [phone]   Immunizations Provided:       Discharge Diagnosis: Post menopausal bleeding  Discharged To: ANTICIPATED%>  Home Treatments: TREATMENTS, ANTICIPATED%>  Devices/Equipment: EQUIPMENT REHAB%>  Post Hospital Services: HOSPITAL SERVICES%>  Professional Skilled Services: SKILLED SERVICES%>  Therapist, sportspecial Services and Community Resources:                SERV AND COMM RES, ANTICIPATED%>  Mode of Discharge Transportation: TRANSPORTATION%>  Discharge Orders:       Comment:      Medications   During the course of your visit, your medication list was updated with the most current information. The details of those changes are reflected below:         Medications that have not changed  Other Medications  clonazePAM (KlonoPIN 0.5 mg oral tablet) 1 Tabs Oral (given by mouth) 2 times a day as needed., SOUND ALIKE / LOOK ALIKE - VERIFY DRUG   SOUND ALIKE / LOOK ALIKE - VERIFY DRUG  Last Dose:____________________  cyanocobalamin (Vitamin B12) 1 Tabs Oral (given by mouth) every day.  Last Dose:____________________  cyclobenzaprine (Flexeril 10 mg oral tablet) 1 Tabs Oral (given by mouth) 3 times a day as needed., " THIS MEDICATION IS ASSOCIATED WITH AN INCREASED RISK OF FALLS."  Last Dose:____________________  ergocalciferol (Vitamin D) 400 Milligram Oral (given by mouth) every day.  Last Dose:____________________  gabapentin (gabapentin 300 mg oral capsule) 2 Capsules Oral (given by mouth) Once a Day (at bedtime).  Last Dose:____________________  levothyroxine 0.088 Milligram Oral (given by mouth) once a day (in the  morning).  Last Dose:____________________  lutein (Lutein) 1 Capsules Oral (given by mouth) every day.  Taking For and suppl Fibromalic  Last Dose:____________________  multivitamin with minerals (Centrum) 1 Tabs Oral (given by mouth) Monday/Wednesday/Friday.  Last Dose:____________________  norethindrone (norethindrone 5 mg oral tablet) 1 Tabs Oral (given by mouth) Once a Day (at bedtime).  Last Dose:____________________         [facility] would like to thank you for allowing us to assist you with your healthcare needs. The following includes patient education materials and information regarding your injury/illness.     Brandi Fritz, Diamone has been given the following list of follow-up instructions, prescriptions, and patient education materials:  Follow-up Instructions             With: Address: When:   Vickey HugerRebecca Prabhjot Maddux  Within 1 to 2 weeks       With: Address: When:   [facility] [address]  [place], [place]  [id number]  [phone] Business (1)                        It is important to always keep an active list of medications available so that you can share with other providers and manage your medications appropriately. As an additional courtesy, we are also providing you with your final active medications list that you can keep with you.  clonazePAM (KlonoPIN 0.5 mg oral tablet) 1 Tabs Oral (given by mouth) 2 times a day as needed., SOUND ALIKE / LOOK ALIKE - VERIFY DRUG   SOUND ALIKE / LOOK ALIKE - VERIFY DRUG  cyanocobalamin (Vitamin B12) 1 Tabs Oral (given by mouth) every day.  cyclobenzaprine (Flexeril 10 mg oral tablet) 1 Tabs Oral (given by mouth) 3 times a day as needed., " THIS MEDICATION IS ASSOCIATED WITH AN INCREASED RISK OF FALLS."  ergocalciferol (Vitamin D) 400 Milligram Oral (given by mouth) every day.  gabapentin (gabapentin 300 mg oral capsule) 2 Capsules Oral (given by mouth) Once a Day (at bedtime).  levothyroxine 0.088 Milligram Oral (given by mouth) once a  day (in the morning).  lutein (Lutein) 1 Capsules Oral (given by mouth) every day.  Taking For and suppl Fibromalic  multivitamin with minerals (Centrum) 1 Tabs Oral (given by mouth) Monday/Wednesday/Friday.  norethindrone (norethindrone 5 mg oral tablet) 1 Tabs Oral (given by mouth) Once a Day (at bedtime).      Take only the medications listed above. Contact your doctor prior to taking any medications not on this list.        Discharge instructions, if any, will display below     Instructions for Diet: INSTRUCTIONS FOR DIET%>  Instructions for Supplements: SUPPLEMENT INSTRUCTIONS%>   Instructions for Activity: INSTRUCTIONS FOR ACTIVITY%>   Instructions for Wound Care: INSTRUCTIONS FOR WOUND CARE%>     Medication leaflets, if any, will display below         Patient education materials, if any, will display below        Hysteroscopy   Hysteroscopy is a procedure that is done to see inside your uterus. It can help find the cause of problems in the uterus. This helps your health care provider decide on the best treatment. In some cases, it can be used to perform treatment. Hysteroscopy may be done in your health care provider's office or in the hospital.      Why might I need hysteroscopy?   Hysteroscopy may be done based on the results of other tests. It can help find the cause of problems. These can include:   ?? Unusually heavy or long menstrual periods   ?? Bleeding between periods   ?? Postmenopausal bleeding   ?? Trouble becoming pregnant (infertility) or carrying a pregnancy to term   ?? To locate an intrauterine device (IUD)   ?? To perform sterilization   What are the risks and complications of hysteroscopy?   Problems with the procedure are rare. But all procedures have risks. Risks of hysteroscopy include:   ?? Infection   ?? Bleeding   ?? Tearing of the uterine wall   ?? Damage to internal organs   ?? Scarring of the uterus   ?? Fluid overload   ?? Problems with anesthesia (the medication that prevents pain during the  procedure)   How do I get ready for hysteroscopy?   ?? Tell your health care provider if you have any health problems. These include diabetes, heart disease, or bleeding problems.   ?? Tell your health care provider about all the medicines you take. This includes any over-the-counter medications, herbs, or supplements.   ?? You may be told not to use vaginal creams or medication. And you may be told not to have sex or douche.   ?? You may be told not to eat or drink the night before the procedure.   ?? You may be tested for pregnancy and  infection.   ?? You may be asked to sign a consent form.   ?? You may be given a pain reliever to take an hour before the procedure. This helps relieve cramping that may occur.   What happens during a hysteroscopy?   ?? Youll lie on an exam table with your feet in stirrups.   ?? You may be given general anesthesia or medicines to help you relax or sleep. In some cases, an IV line will be put into a vein in your arm or hand. This line is then used to give fluids and medicines.   ?? A tool called a speculum is inserted into the vagina to hold it open. A tool called a dilator may be used to widen the cervix.   ?? Numbing medicine may be applied to the cervix.   ?? The hysteroscope (a long, thin lighted tube) is inserted through the vagina and into the uterus. It is used to see inside the uterus. Images of the uterus are viewed on a monitor.   ?? A gas or fluid may be injected into the uterus to expand it.   ?? Other tools may be put through the hysteroscope. These are used to take tissue samples, remove growths, or place implants for the purpose of sterilization.   What happens after hysteroscopy?   ?? You may have cramps and bleeding for 24 hours after the procedure. This is normal. Use pads instead of tampons.   ?? Do not douche or use tampons until your health care provider says its OK.   ?? Do not use any vaginal medicines until you are told its OK.   ?? Ask your health care provider when its OK to  have sex again.   When should I call my health care provider?   Call your health care provider if you have:   ?? Heavy bleeding (more than 1 pad an hour for 2 or more hours)   ?? A fever over 100.4??F (38.0??C)   ?? Increasing abdominal pain or tenderness   ?? Foul-smelling discharge   Follow-up care   Schedule a follow-up visit with your health care provider. Based on the results of your test, you may need more treatment. Be sure to follow instructions and keep your appointments.     ?? 2000-2015 [employer] [address], [place], [place] [address]. All rights reserved. This information is not intended as a substitute for professional medical care. Always follow your healthcare professional's instructions.               IS IT A STROKE?  Act FAST and Check for these signs:     FACE                  Does the face look uneven?     ARM                    Does one arm drift down?     SPEECH             Does their speech sound strange?     TIME                   Call 9-1-1 at any sign of stroke  Heart Attack Signs  Chest discomfort: Most heart attacks involve discomfort in the center of the chest and lasts more than a few minutes, or goes away and comes back. It can feel like uncomfortable  pressure, squeezing, fullness or pain.  Discomfort in upper body: Symptoms can include pain or discomfort in one or both arms, back, neck, jaw or stomach.  Shortness of breath: With or without discomfort.  Other signs: Breaking out in a cold sweat, nausea, or lightheaded.  Remember, MINUTES DO MATTER. If you experience any of these heart attack warning signs, call 9-1-1 to get immediate medical attention!             Yes - Patient/Family/Caregiver demonstrates understanding of instructions given  ______________________________ ___________ ___________________ ___________  Patient/Family/ Caregiver Signature Date/Time          Provider Signature Date/Time

## 2015-07-30 NOTE — Consults (Signed)
Postanesthesia Evaluation        Patient:   Brandi Fritz, Brandi Fritz             MRN: [id number]            FIN: [id number]               Age:   59 years     Sex:  Female     DOB:  Feb 23, 1956   Associated Diagnoses:   None   Author:   Jacques EarthlyNeal,  Takenya Travaglini L.-MD      Postoperative Information   Post Operative Info:       Post operative day: Post Anesthesia Care Unit.       Assessment   Postanesthesia assessment   Respiratory function: Respiratory rate, airway, and oxygen saturation are at adequate levels.     Cardiovascular function: Heart Rate stable, Blood Pressure stable, Postoperative hydration status Adequate.     Mental status: appropriate for level of anesthesia.     Temperature: within normal limits.     Pain Control: Adequate.     Nausea/Vomiting: Absent.

## 2015-07-30 NOTE — Consults (Signed)
Preanesthesia Evaluation        Patient:   Brandi Fritz, Brandi Fritz             MRN: [id number]            FIN: [id number]               Age:   59 years     Sex:  Female     DOB:  1955-10-24   Associated Diagnoses:   None   Author:   Jacques EarthlyNeal,  Ibrahim Mcpheeters L.-MD      Preoperative Information   Procedure/ Case: hysteroscopy, D&C   Surgeon scheduled: [name],  Rebecca G.-MD   NPO:  > 8 hours.    Anesthesia history     Patient's history: negative.     Family's history: negative.        Health Status   Allergies:    Allergic Reactions (Selected)  No Known Allergies,       (Active and Proposed Allergies Only)  No Known Allergies   (Severity: Unknown severity, Onset: Unknown)     Current medications:    Home Medications (9) Active  Centrum 1 tabs, Oral, Mo/We/Fr  Flexeril 10 mg oral tablet 10 mg = 1 tabs, PRN, Oral, TID  gabapentin 300 mg oral capsule 600 mg = 2 caps, Oral, Once a Day (at bedtime)  KlonoPIN 0.5 mg oral tablet 0.5 mg = 1 tabs, PRN, Oral, BID  levothyroxine 0.088 mg, Oral, qAM  Lutein 1 caps, Oral, Daily  norethindrone 5 mg oral tablet 5 mg = 1 tabs, Oral, Once a Day (at bedtime)  Vitamin B12 1 tabs, Oral, Daily  Vitamin D 400 mg, Oral, Daily     Problem list:    Active Problems (9)  Anxiety   Dental crowns present   Fibromyalgia   Hypothyroidism   Inguinal hernia   Post menopausal bleeding   Rosacea   Vaginal yeast infection   Visual aid   ,    Problems   (Active Problems Only)    Hypothyroidism   (SNOMED CT: [id number], Onset: --)   Comment:  takes suppl.  Selena Batten(Cody, RN, August Albinoleanor F on 07/29/15)   Post menopausal bleeding   (SNOMED CT: AZEGxwEmLzUcjUBywKgAAg, Onset: --)   Comment:  for hysteroscopy D&C  Selena Batten(Cody, RN, August Albinoleanor F on 07/29/15)   Inguinal hernia   (SNOMED CT: 3086578469838-460-0130, Onset: --)   Comment:  occ. bothers her  Selena Batten(Cody, RN, August Albinoleanor F on 07/29/15)   Rosacea   (SNOMED CT: [id number], Onset: --)  Vaginal yeast infection   (SNOMED CT: [id number], Onset: --)   Comment:   pt states gets them frequently  Selena Batten(Cody, RN, August Albinoleanor F on 07/29/15)                        Dental crowns present   (SNOMED CT: [id number], Onset: --)  Fibromyalgia   (SNOMED CT: BgCTWgDjf6PNMLiECwsLDQ, Onset: --)  Anxiety   (SNOMED CT: [id number], Onset: --)  Visual aid   (SNOMED CT: [id number], Onset: --)   Comment:  glasses  Selena Batten(Cody, RN, August Albinoleanor F on 07/29/15)         Histories   Procedure history:    Face lift ([id number]) on 12/26/2014 at 59 Years.  Comments:  07/29/2015 10:33 - Selena Battenody, RN, August AlbinoEleanor F  "lower"face lift with light sedation-but kept waking during procedure  Arthroscopy, knee, surgical; with lateral release ([id number]) on 09/26/2013 at 57 Years.  Comments:  07/29/2015 10:30 - Selena Battenody, RN, August AlbinoEleanor F  left  Arthroscopic repair of meniscus ([id number]) on 09/26/2010 at 54 Years.  Comments:  07/29/2015 10:28 - Selena Batten, RN, August Albino  right  Tonsillectomy and adenoidectomy ([id number]) in 1986 at 29 Years.  Cesarean section ([id number]) on 09/27/1979 at 23 Years.  Comments:  07/29/2015 10:31 - Selena Batten, RN, August Albino  colon perforated during c-section-required second surgery same day  Repair of perforated colon ([id number]) on 09/27/1979 at 23 Years.  Comments:  07/29/2015 10:32 - Selena Batten, RN, August Albino  repair hours after c-section  Cesarean section [phone]).  Comments:  07/29/2015 10:32 - Selena Batten, RN, August Albino  second c-section   Social History        Social & Psychosocial Habits    Alcohol  07/29/2015  Use: Denies    Substance Abuse  07/29/2015  Use: Denies    Tobacco  07/29/2015  Use: Never smoker  .        Physical Examination   Measurements from flowsheet : Measurements   07/29/2015 9:23 EDT Height/Length Estimated 172.7 cm    Weight Estimated 70.5 kg      Airway:          Mallampati classification: I (soft palate, fauces, uvula, pillars visible).         Thyromental Distance: Normal.    Metabolic Equivalents in Exercise Testing:  4-7.        Review / Management   Results review:     No qualifying data available.       Assessment and Plan   American Society of Anesthesiologists#(ASA) physical status classification:  Class II.    Anesthetic Preoperative Plan     Anesthetic technique: General anesthesia.     Risks discussed.

## 2015-07-30 NOTE — Op Note (Signed)
Operative Report    [facility]  Royalti Schauf G. Finnis Colee, MD  Service Date: 11/03/201Levander Campion6    PREOPERATIVE DIAGNOSIS:  Persistent postmenopausal bleeding.    POSTOPERATIVE DIAGNOSIS:  Persistent postmenopausal bleeding.    PROCEDURE:  Diagnostic hysteroscopy with dilation and curettage.    ANESTHESIA:  General.    SURGEON:  Dr. Francoise SchaumannBaird.    FINDINGS AT TIME OF SURGERY:  The patient was noted to have a slightly  enlarged uterus which sounded to 9 cm.  At the time of hysteroscopy  she had 2 benign-appearing endometrial polyps with otherwise normal  intracavitary findings.  At the time of curettage the polyps were  removed and sent to pathology.    SURGICAL TECHNIQUE:  The patient was taken to the operating room,  placed on the table in the dorsal supine position.  After induction of  general anesthesia, she was placed in the dorsal lithotomy position  and prepped and draped in the usual manner for a vaginal procedure.  A  weighted speculum was placed in her vagina, and a single-tooth  tenaculum was used to grasp the anterior lip of the cervix.  The  bladder was drained with a sterile red rubber catheter.  The cervix  was then gently dilated using cervical dilators.  The uterus sounded  to 9 cm.      The hysteroscope was then inserted, and using saline for insufflation  the endometrial cavity was well visualized with findings as stated  above noted.  After noting these findings, a sharp curettage was  performed with a moderate amount of tissue and polyps removed and sent  to pathology.  The hysteroscope was again inserted, and the polyps  were noted to no longer be present.  After noting these findings, the  procedure was terminated.      At the end of the procedure all sponge and instrument counts were  correct.  There were no complications. Estimated blood loss was less  than 50 mL.      Reece Packerebecca G Brexton Sofia, MD  TR: *n DD: 07/30/2015 10:32 TD: 07/30/2015 10:40 Job#: [id number]     DOC#: [id number]       Signature Line    Electronically Signed  on 07/30/2015 05:20 PM EDT  ________________________________________________  [name],  Lenda Baratta G.-MD, MD

## 2015-07-30 NOTE — H&P (Signed)
History and Physical    [facility]  [name] G. Jerry Clyne, MD  Admission Date: 07/30/2015    REASON FOR HOSPITALIZATION:  Persistent postmenopausal bleeding.    HISTORY OF PRESENT ILLNESS:  The patient is a 59 year old white female  gravida 2, para 2, with history for persistent postmenopausal bleeding  who is being admitted for diagnostic hysteroscopy with dilation and  curettage.    PAST MEDICAL HISTORY:  Significant for hypothyroidism and anxiety.    PAST SURGICAL HISTORY:  Positive for right knee surgery, previous  cesarean sections times 2.    PAST OB HISTORY:  Significant for previous cesarean sections times 2.    PAST GYN HISTORY:  Significant for the fact the patient is menopausal.    SOCIAL HISTORY:  She is on current hormone replacement therapy.      MEDICATIONS:  Medication list is documented in her chart.    ALLERGIES:  She has no known allergies.    REVIEW OF SYSTEMS:  Consistent only with postmenopausal bleeding.    PHYSICAL EXAMINATION:  She is afebrile with stable vital signs.  She  is alert and oriented.  Her HEENT exam is unremarkable.  NECK:   Supple, with no masses.  Chest is clear to auscultation.  HEART:   Regular rate and rhythm.  ABDOMEN:  Soft and nontender.  PELVIC EXAM:   Reveals normal external genitalia with normal vagina and cervix with  light bleeding noted.  Bimanual exam was unremarkable with a normal  size uterus and no adnexal masses or tenderness.  EXTREMITIES:  Reveal  no edema.    IMPRESSION:  Persistent postmenopausal bleeding.    PLAN:  Diagnostic hysteroscopy with dilation and curettage.      Brandi Packerebecca G [name], MD  TR: *n DD: 07/30/2015 08:12 TD: 07/30/2015 08:53 Job#: [id number]     DOC#: [id number]       Signature Line    Electronically Signed on 07/30/2015 05:20 PM EDT  ________________________________________________  [name],  [name], MD

## 2015-07-30 NOTE — Care Plan (Signed)
ED Pat Edu       ;        [facility]  [address]  [place], [place] [address]  [phone]  Patient Discharge Instructions     Name: Brandi Fritz, Brandi Fritz  Current Date: 07/30/2015 11:40:41  DOB: Dec 23, 1955 MRN: [id number] FIN: MWN%[id number]  Patient Address: [address] [place] [facility] [address]  Patient Phone: [phone]  Primary Care Provider:  Name: Luis Abed D.-MD  Phone: [phone]   Immunizations Provided:       Discharge Diagnosis: Post menopausal bleeding  Discharged To: ANTICIPATED%>  Home Treatments: TREATMENTS, ANTICIPATED%>  Devices/Equipment: EQUIPMENT REHAB%>  Post Hospital Services: HOSPITAL SERVICES%>  Professional Skilled Services: SKILLED SERVICES%>  Therapist, sports and Community Resources:                SERV AND COMM RES, ANTICIPATED%>  Mode of Discharge Transportation: TRANSPORTATION%>  Discharge Orders:         Discharge Patient 07/30/15 10:41:00 EDT         Comment:      Medications   During the course of your visit, your medication list was updated with the most current information. The details of those changes are reflected below:         Medications that have not changed  Other Medications  clonazePAM (KlonoPIN 0.5 mg oral tablet) 1 Tabs Oral (given by mouth) 2 times a day as needed., SOUND ALIKE / LOOK ALIKE - VERIFY DRUG   SOUND ALIKE / LOOK ALIKE - VERIFY DRUG  Last Dose:____________________  cyanocobalamin (Vitamin B12) 1 Tabs Oral (given by mouth) every day.  Last Dose:____________________  cyclobenzaprine (Flexeril 10 mg oral tablet) 1 Tabs Oral (given by mouth) 3 times a day as needed., " THIS MEDICATION IS ASSOCIATED WITH AN INCREASED RISK OF FALLS."  Last Dose:____________________  ergocalciferol (Vitamin D) 400 Milligram Oral (given by mouth) every day.  Last Dose:____________________  gabapentin (gabapentin 300 mg oral capsule) 2 Capsules Oral (given by mouth) Once a Day (at bedtime).  Last Dose:____________________  levothyroxine 0.088 Milligram  Oral (given by mouth) once a day (in the morning).  Last Dose:____________________  lutein (Lutein) 1 Capsules Oral (given by mouth) every day.  Taking For and suppl Fibromalic  Last Dose:____________________  multivitamin with minerals (Centrum) 1 Tabs Oral (given by mouth) Monday/Wednesday/Friday.  Last Dose:____________________  norethindrone (norethindrone 5 mg oral tablet) 1 Tabs Oral (given by mouth) Once a Day (at bedtime).  Last Dose:____________________         [facility] would like to thank you for allowing [place] to assist you with your healthcare needs. The following includes patient education materials and information regarding your injury/illness.     Cheri Fowler has been given the following list of follow-up instructions, prescriptions, and patient education materials:  Follow-up Instructions             With: Address: When:   Vickey Huger  Within 1 to 2 weeks       With: Address: When:   [facility] [address] [place]. [place], [place]  [address]  [phone] Business (1)                        It is important to always keep an active list of medications available so that you can share with other providers and manage your medications appropriately. As an additional courtesy, we are also providing you with your final  active medications list that you can keep with you.           clonazePAM (KlonoPIN 0.5 mg oral tablet) 1 Tabs Oral (given by mouth) 2 times a day as needed., SOUND ALIKE / LOOK ALIKE - VERIFY DRUG   SOUND ALIKE / LOOK ALIKE - VERIFY DRUG  cyanocobalamin (Vitamin B12) 1 Tabs Oral (given by mouth) every day.  cyclobenzaprine (Flexeril 10 mg oral tablet) 1 Tabs Oral (given by mouth) 3 times a day as needed., " THIS MEDICATION IS ASSOCIATED WITH AN INCREASED RISK OF FALLS."  ergocalciferol (Vitamin D) 400 Milligram Oral (given by mouth) every day.  gabapentin (gabapentin 300 mg oral capsule) 2 Capsules Oral (given by mouth) Once a Day (at bedtime).  levothyroxine  0.088 Milligram Oral (given by mouth) once a day (in the morning).  lutein (Lutein) 1 Capsules Oral (given by mouth) every day.  Taking For and suppl Fibromalic  multivitamin with minerals (Centrum) 1 Tabs Oral (given by mouth) Monday/Wednesday/Friday.  norethindrone (norethindrone 5 mg oral tablet) 1 Tabs Oral (given by mouth) Once a Day (at bedtime).      Take only the medications listed above. Contact your doctor prior to taking any medications not on this list.        Discharge instructions, if any, will display below     Instructions for Diet: INSTRUCTIONS FOR DIET%>  Instructions for Supplements: SUPPLEMENT INSTRUCTIONS%>   Instructions for Activity: INSTRUCTIONS FOR ACTIVITY%>   Instructions for Wound Care: INSTRUCTIONS FOR WOUND CARE%>     Medication leaflets, if any, will display below         Patient education materials, if any, will display below        [facility]   Your health care provider has recommended you have a D&C (dilation and curettage). This common procedure helps your health care provider learn more about problems inside your uterus or is done to treat a miscarriage. During a D&C, the cervix (opening of the uterus) is widened, or dilated. Tissue samples are then removed from the endometrium (lining of the uterus) with an instrument called a curette or with suction. In many cases, D&C is done to find the cause of abnormal vaginal bleeding. Or, you may need a D&C as a form of treatment.    Youll lie on a table with your legs in stirrups, as you do for a pelvic exam.        A hysteroscopy, in which a small instrument is used to see the inside of the uterus, is usually done along with the [facility] for a gynecological problem. Hysteroscopy and D&C can be done in the operating room or in the health care provider's office, depending on the health care provider who does it.   Preparing for [facility]    Arrange for an adult family member or friend to drive you home.    Dont eat or drink anything after the midnight  before your [facility] (unless told otherwise by your health care provider).   During your D&C   Just before your D&C, you may receive medicine to prevent pain. This may be given through an IV. You may be awake but relaxed during the procedure. Or, you may be completely asleep. The type of anesthesia used is different depending on where the procedure takes place. The procedure will not begin until the pain medicine has taken effect. During your D&C:       After the cervical canal is dilated, a curette is inserted into  the uterus to take tissue samples     Instruments are used to hold the vagina open and to steady the uterus. The cervical canal is widened using tapered instruments called dilators.    Usually a hysteroscope (thin, rigid, or flexible telescope) is inserted into the vagina to take images of the inside of the uterus. This allows your health care provider to see into the uterus.    The curette or suction is inserted into the uterus. Tissue samples are taken from several areas. These samples are sent to a lab to be studied.   After your D&C    You will rest for a while in a recovery area.    You can expect some cramping for a few hours after the D&C. This can be controlled with an over-the-counter pain reliever.    You may have some light bleeding for a few weeks. Use pads instead of tampons.    Take showers instead of baths for about a week. Ask your health care provider if you should avoid exercising or having sex for a period of time.   Risks and complications   D&C rarely causes complications. However, as with any procedure, D&C has some risks. Before your [facility], your health care provider will discuss these with you. You will be asked to sign a consent form. Risks may include:    Infection    Heavy bleeding    Perforation of the uterine wall or damage to nearby organs    Scar tissue may form causing the lining of the uterus to adhere to itself. This can cause problems with menstrual flow or  difficulty getting pregnant in the future. This is called Asherman syndrome.    The need for additional tests or procedures    Risks associated with anesthesia (the medicine that makes you sleep during surgery)   Call your health care provider    Contact your health care provider if you have:    Heavy bleeding (more than 1 pad an hour)    A fever over 101F (38.33C)    Increasing abdominal pain, tenderness, or cramping    Foul-smelling discharge      2000-2015 [employer] [address] [place], [place] [address]. All rights reserved. This information is not intended as a substitute for professional medical care. Always follow your healthcare professional's instructions.         Discharge Instructions: After Your Surgery   Youve just had surgery. During surgery you were given medicine called anesthesia to keep you relaxed and free of pain. After surgery you may have some pain or nausea. This is common. Here are some tips for feeling better and getting well after surgery.       Stay on schedule with your medication.    Going home   Your doctor or nurse will show you how to take care of yourself when you go home. He or she will also answer your questions. Have an adult family member or friend drive you home. For the first 24 hours after your surgery:    Do not drive or use heavy equipment.    Do not make important decisions or sign legal papers.    Do not drink alcohol.    Have someone stay with you, if needed. He or she can watch for problems and help keep you safe.   Be sure to go to all follow-up visits with your doctor. And rest after your surgery for as long as your  doctor tells you to.   Coping with pain   If you have pain after surgery, pain medicine will help you feel better. Take it as told, before pain becomes severe. Also, ask your doctor or pharmacist about other ways to control pain. This might be with heat, ice, or relaxation. And follow any other instructions your surgeon or  nurse gives you.   Tips for taking pain medicine   To get the best relief possible, remember these points:    Pain medicines can upset your stomach. Taking them with a little food may help.    Most pain relievers taken by mouth need at least 20 to 30 minutes to start to work.    Taking medicine on a schedule can help you remember to take it. Try to time your medicine so that you can take it before starting an activity. This might be before you get dressed, go for a walk, or sit down for dinner.    Constipation is a common side effect of pain medicines. Call your doctor before taking any medicines such as laxatives or stool softeners to help ease constipation. Also ask if you should skip any foods. Drinking lots of fluids and eating foods such as fruits and vegetables that are high in fiber can also help. Remember, do not take laxatives unless your surgeon has prescribed them.    Drinking alcohol and taking pain medicine can cause dizziness and slow your breathing. It can even be deadly. Do not drink alcohol while taking pain medicine.    Pain medicine can make you react more slowly to things. Do not drive or run machinery while taking pain medicine.   Your health care provider may tell you to take acetaminophen to help ease your pain. Ask him or her how much you are supposed to take each day. Acetaminophen or other pain relievers may interact with your prescription medicines or other over-the-counter (OTC) drugs. Some prescription medicines have acetaminophen and other ingredients. Using both prescription and OTC acetaminophen for pain can cause you to overdose. Read the labels on your OTC medicines with care. This will help you to clearly know the list of ingredients, how much to take, and any warnings. It may also help you not take too much acetaminophen. If you have questions or do not understand the information, ask your pharmacist or health care provider to explain it to you before you take the OTC  medicine.   Managing nausea   Some people have an upset stomach after surgery. This is often because of anesthesia, pain, or pain medicine, or the stress of surgery. These tips will help you handle nausea and eat healthy foods as you get better. If you were on a special food plan before surgery, ask your doctor if you should follow it while you get better. These tips may help:    Do not push yourself to eat. Your body will tell you when to eat and how much.    Start off with clear liquids and soup. They are easier to digest.    Next try semi-solid foods, such as mashed potatoes, applesauce, and gelatin, as you feel ready.    Slowly move to solid foods. Dont eat fatty, rich, or spicy foods at first.    Do not force yourself to have 3 large meals a day. Instead eat smaller amounts more often.    Take pain medicines with a small amount of solid food, such as crackers or toast, to avoid nausea.  Call your surgeon if.    You still have pain an hour after taking medicine. The medicine may not be strong enough.    You feel too sleepy, dizzy, or groggy. The medicine may be too strong.    You have side effects like nausea, vomiting, or skin changes, such as rash, itching, or hives.        If you have obstructive sleep apnea   You were given anesthesia medicine during surgery to keep you comfortable and free of pain. After surgery, you may have more apnea spells because of this medicine and other medicines you were given. The spells may last longer than usual.    At home:    Keep using the continuous positive airway pressure (CPAP) device when you sleep. Unless your health care provider tells you not to, use it when you sleep, day or night. CPAP is a common device used to treat obstructive sleep apnea.    Talk with your provider before taking any pain medicine, muscle relaxants, or sedatives. Your provider will tell you about the possible dangers of taking these medicines.      [address] [employer] [address] [place], [place] [address]. All rights reserved. This information is not intended as a substitute for professional medical care. Always follow your healthcare professional's instructions.         Hysteroscopy   Hysteroscopy is a procedure that is done to see inside your uterus. It can help find the cause of problems in the uterus. This helps your health care provider decide on the best treatment. In some cases, it can be used to perform treatment. Hysteroscopy may be done in your health care provider's office or in the hospital.      Why might I need hysteroscopy?   Hysteroscopy may be done based on the results of other tests. It can help find the cause of problems. These can include:    Unusually heavy or long menstrual periods    Bleeding between periods    Postmenopausal bleeding    Trouble becoming pregnant (infertility) or carrying a pregnancy to term    To locate an intrauterine device (IUD)    To perform sterilization   What are the risks and complications of hysteroscopy?   Problems with the procedure are rare. But all procedures have risks. Risks of hysteroscopy include:    Infection    Bleeding    Tearing of the uterine wall    Damage to internal organs    Scarring of the uterus    Fluid overload    Problems with anesthesia (the medication that prevents pain during the procedure)   How do I get ready for hysteroscopy?    Tell your health care provider if you have any health problems. These include diabetes, heart disease, or bleeding problems.    Tell your health care provider about all the medicines you take. This includes any over-the-counter medications, herbs, or supplements.    You may be told not to use vaginal creams or medication. And you may be told not to have sex or douche.    You may be told not to eat or drink the night before the procedure.    You may be tested for pregnancy and infection.    You may be asked to sign a consent form.    You may be given  a pain reliever to take an hour before the procedure. This helps relieve cramping that may occur.  What happens during a hysteroscopy?    Youll lie on an exam table with your feet in stirrups.    You may be given general anesthesia or medicines to help you relax or sleep. In some cases, an IV line will be put into a vein in your arm or hand. This line is then used to give fluids and medicines.    A tool called a speculum is inserted into the vagina to hold it open. A tool called a dilator may be used to widen the cervix.    Numbing medicine may be applied to the cervix.    The hysteroscope (a long, thin lighted tube) is inserted through the vagina and into the uterus. It is used to see inside the uterus. Images of the uterus are viewed on a monitor.    A gas or fluid may be injected into the uterus to expand it.    Other tools may be put through the hysteroscope. These are used to take tissue samples, remove growths, or place implants for the purpose of sterilization.   What happens after hysteroscopy?    You may have cramps and bleeding for 24 hours after the procedure. This is normal. Use pads instead of tampons.    Do not douche or use tampons until your health care provider says its OK.    Do not use any vaginal medicines until you are told its OK.    Ask your health care provider when its OK to have sex again.   When should I call my health care provider?   Call your health care provider if you have:    Heavy bleeding (more than 1 pad an hour for 2 or more hours)    A fever over 100.75F (38.0C)    Increasing abdominal pain or tenderness    Foul-smelling discharge   Follow-up care   Schedule a follow-up visit with your health care provider. Based on the results of your test, you may need more treatment. Be sure to follow instructions and keep your appointments.      [address] [employer] [address], [place], [place] [address]. All rights reserved. This information is not  intended as a substitute for professional medical care. Always follow your healthcare professional's instructions.               IS IT A STROKE?  Act FAST and Check for these signs:     FACE                  Does the face look uneven?     ARM                    Does one arm drift down?     SPEECH             Does their speech sound strange?     TIME                   Call 9-1-1 at any sign of stroke  Heart Attack Signs  Chest discomfort: Most heart attacks involve discomfort in the center of the chest and lasts more than a few minutes, or goes away and comes back. It can feel like uncomfortable pressure, squeezing, fullness or pain.  Discomfort in upper body: Symptoms can include pain or discomfort in one or both arms, back, neck, jaw or stomach.  Shortness of breath: With or without discomfort.  Other signs: Breaking out  in a cold sweat, nausea, or lightheaded.  Remember, MINUTES DO MATTER. If you experience any of these heart attack warning signs, call 9-1-1 to get immediate medical attention!             Yes - Patient/Family/Caregiver demonstrates understanding of instructions given  ______________________________ ___________ ___________________ ___________  Patient/Family/ Caregiver Signature Date/Time          Provider Signature Date/Time

## 2015-07-30 NOTE — Care Plan (Signed)
ED Pat Edu       ;        [facility]  [address]  [place], [place] [address]  [phone]  Patient Discharge Instructions     Name: Brandi Fritz, Brandi Fritz  Current Date: 07/30/2015 09:55:53  DOB: 10-28-1955 MRN: [id number] FIN: [id number]  Patient Address: [address] [facility] [place] [address]  Patient Phone: [phone]  Primary Care Provider:  Name: Luis Abed D.-MD  Phone: [phone]   Immunizations Provided:       Discharge Diagnosis:   Discharged To: ANTICIPATED%>  Home Treatments: TREATMENTS, ANTICIPATED%>  Devices/Equipment: EQUIPMENT REHAB%>  Post Hospital Services: HOSPITAL SERVICES%>  Professional Skilled Services: SKILLED SERVICES%>  Therapist, sports and Community Resources:                SERV AND COMM RES, ANTICIPATED%>  Mode of Discharge Transportation: TRANSPORTATION%>  Discharge Orders:       Comment:      Medications   During the course of your visit, your medication list was updated with the most current information. The details of those changes are reflected below:         Medications that have not changed  Other Medications  clonazePAM (KlonoPIN 0.5 mg oral tablet) 1 Tabs Oral (given by mouth) 2 times a day as needed., SOUND ALIKE / LOOK ALIKE - VERIFY DRUG   SOUND ALIKE / LOOK ALIKE - VERIFY DRUG  Last Dose:____________________  cyanocobalamin (Vitamin B12) 1 Tabs Oral (given by mouth) every day.  Last Dose:____________________  cyclobenzaprine (Flexeril 10 mg oral tablet) 1 Tabs Oral (given by mouth) 3 times a day as needed., " THIS MEDICATION IS ASSOCIATED WITH AN INCREASED RISK OF FALLS."  Last Dose:____________________  ergocalciferol (Vitamin D) 400 Milligram Oral (given by mouth) every day.  Last Dose:____________________  gabapentin (gabapentin 300 mg oral capsule) 2 Capsules Oral (given by mouth) Once a Day (at bedtime).  Last Dose:____________________  levothyroxine 0.088 Milligram Oral (given by mouth) once a day (in the morning).  Last  Dose:____________________  lutein (Lutein) 1 Capsules Oral (given by mouth) every day.  Taking For and suppl Fibromalic  Last Dose:____________________  multivitamin with minerals (Centrum) 1 Tabs Oral (given by mouth) Monday/Wednesday/Friday.  Last Dose:____________________  norethindrone (norethindrone 5 mg oral tablet) 1 Tabs Oral (given by mouth) Once a Day (at bedtime).  Last Dose:____________________         [facility] would like to thank you for allowing [place] to assist you with your healthcare needs. The following includes patient education materials and information regarding your injury/illness.     Cheri Fowler has been given the following list of follow-up instructions, prescriptions, and patient education materials:  Follow-up Instructions             With: Address: When:   Vickey Huger  Within 1 to 2 weeks       With: Address: When:   [facility] [address] [place], [place]  [address]  [phone] Business (1)                        It is important to always keep an active list of medications available so that you can share with other providers and manage your medications appropriately. As an additional courtesy, we are also providing you with your final active medications list that you can keep with you.  clonazePAM (KlonoPIN 0.5 mg oral tablet) 1 Tabs Oral (given by mouth) 2 times a day as needed., SOUND ALIKE / LOOK ALIKE - VERIFY DRUG   SOUND ALIKE / LOOK ALIKE - VERIFY DRUG  cyanocobalamin (Vitamin B12) 1 Tabs Oral (given by mouth) every day.  cyclobenzaprine (Flexeril 10 mg oral tablet) 1 Tabs Oral (given by mouth) 3 times a day as needed., " THIS MEDICATION IS ASSOCIATED WITH AN INCREASED RISK OF FALLS."  ergocalciferol (Vitamin D) 400 Milligram Oral (given by mouth) every day.  gabapentin (gabapentin 300 mg oral capsule) 2 Capsules Oral (given by mouth) Once a Day (at bedtime).  levothyroxine 0.088 Milligram Oral (given by mouth) once a day (in the  morning).  lutein (Lutein) 1 Capsules Oral (given by mouth) every day.  Taking For and suppl Fibromalic  multivitamin with minerals (Centrum) 1 Tabs Oral (given by mouth) Monday/Wednesday/Friday.  norethindrone (norethindrone 5 mg oral tablet) 1 Tabs Oral (given by mouth) Once a Day (at bedtime).      Take only the medications listed above. Contact your doctor prior to taking any medications not on this list.        Discharge instructions, if any, will display below     Instructions for Diet: INSTRUCTIONS FOR DIET%>  Instructions for Supplements: SUPPLEMENT INSTRUCTIONS%>   Instructions for Activity: INSTRUCTIONS FOR ACTIVITY%>   Instructions for Wound Care: INSTRUCTIONS FOR WOUND CARE%>     Medication leaflets, if any, will display below         Patient education materials, if any, will display below        Hysteroscopy   Hysteroscopy is a procedure that is done to see inside your uterus. It can help find the cause of problems in the uterus. This helps your health care provider decide on the best treatment. In some cases, it can be used to perform treatment. Hysteroscopy may be done in your health care provider's office or in the hospital.      Why might I need hysteroscopy?   Hysteroscopy may be done based on the results of other tests. It can help find the cause of problems. These can include:   ?? Unusually heavy or long menstrual periods   ?? Bleeding between periods   ?? Postmenopausal bleeding   ?? Trouble becoming pregnant (infertility) or carrying a pregnancy to term   ?? To locate an intrauterine device (IUD)   ?? To perform sterilization   What are the risks and complications of hysteroscopy?   Problems with the procedure are rare. But all procedures have risks. Risks of hysteroscopy include:   ?? Infection   ?? Bleeding   ?? Tearing of the uterine wall   ?? Damage to internal organs   ?? Scarring of the uterus   ?? Fluid overload   ?? Problems with anesthesia (the medication that prevents pain during the procedure)    How do I get ready for hysteroscopy?   ?? Tell your health care provider if you have any health problems. These include diabetes, heart disease, or bleeding problems.   ?? Tell your health care provider about all the medicines you take. This includes any over-the-counter medications, herbs, or supplements.   ?? You may be told not to use vaginal creams or medication. And you may be told not to have sex or douche.   ?? You may be told not to eat or drink the night before the procedure.   ?? You may be tested for pregnancy and  infection.   ?? You may be asked to sign a consent form.   ?? You may be given a pain reliever to take an hour before the procedure. This helps relieve cramping that may occur.   What happens during a hysteroscopy?   ?? Youll lie on an exam table with your feet in stirrups.   ?? You may be given general anesthesia or medicines to help you relax or sleep. In some cases, an IV line will be put into a vein in your arm or hand. This line is then used to give fluids and medicines.   ?? A tool called a speculum is inserted into the vagina to hold it open. A tool called a dilator may be used to widen the cervix.   ?? Numbing medicine may be applied to the cervix.   ?? The hysteroscope (a long, thin lighted tube) is inserted through the vagina and into the uterus. It is used to see inside the uterus. Images of the uterus are viewed on a monitor.   ?? A gas or fluid may be injected into the uterus to expand it.   ?? Other tools may be put through the hysteroscope. These are used to take tissue samples, remove growths, or place implants for the purpose of sterilization.   What happens after hysteroscopy?   ?? You may have cramps and bleeding for 24 hours after the procedure. This is normal. Use pads instead of tampons.   ?? Do not douche or use tampons until your health care provider says its OK.   ?? Do not use any vaginal medicines until you are told its OK.   ?? Ask your health care provider when its OK to have sex  again.   When should I call my health care provider?   Call your health care provider if you have:   ?? Heavy bleeding (more than 1 pad an hour for 2 or more hours)   ?? A fever over 100.4??F (38.0??C)   ?? Increasing abdominal pain or tenderness   ?? Foul-smelling discharge   Follow-up care   Schedule a follow-up visit with your health care provider. Based on the results of your test, you may need more treatment. Be sure to follow instructions and keep your appointments.     ?? 2000-2015 [employer] [address], [place], [place] [address]. All rights reserved. This information is not intended as a substitute for professional medical care. Always follow your healthcare professional's instructions.               IS IT A STROKE?  Act FAST and Check for these signs:     FACE                  Does the face look uneven?     ARM                    Does one arm drift down?     SPEECH             Does their speech sound strange?     TIME                   Call 9-1-1 at any sign of stroke  Heart Attack Signs  Chest discomfort: Most heart attacks involve discomfort in the center of the chest and lasts more than a few minutes, or goes away and comes back. It can feel like uncomfortable  pressure, squeezing, fullness or pain.  Discomfort in upper body: Symptoms can include pain or discomfort in one or both arms, back, neck, jaw or stomach.  Shortness of breath: With or without discomfort.  Other signs: Breaking out in a cold sweat, nausea, or lightheaded.  Remember, MINUTES DO MATTER. If you experience any of these heart attack warning signs, call 9-1-1 to get immediate medical attention!             Yes - Patient/Family/Caregiver demonstrates understanding of instructions given  ______________________________ ___________ ___________________ ___________  Patient/Family/ Caregiver Signature Date/Time          Provider Signature Date/Time

## 2015-08-28 NOTE — Nursing Note (Signed)
Nursing Discharge Summary - Text       Physician Discharge Summary Entered On:  08/28/2015 11:21 EST    Performed On:  08/28/2015 11:20 EST by Barbera SettersVINING, RN, LISA F               DC Information   Provider Instructions for Diet :   A Healthy Diet   Barbera SettersVINING, RN, LISA F - 08/28/2015 11:20 EST   Provider Instructions for Activity :   May shower, Other: See Dr. Joylene GrapesBakers Detail post op breast instruction sheet   Barbera SettersVINING, RN, LISA F - 08/28/2015 11:23 EST     Provider Instructions for Wound Care :   Keep open to air, May shower, Other: Report elevated temp over 101, drainage  or redness from wound   Barbera SettersVINING, RN, LISA F - 08/28/2015 11:20 EST

## 2015-08-28 NOTE — Op Note (Signed)
Operative Report    [place]  Benancio Deeds, MD  Service Date: 08/28/2015    PREOPERATIVE DIAGNOSIS:  Left breast clinical T1N0 breast cancer.    POSTOPERATIVE DIAGNOSIS:  Left breast clinical T1N0 breast cancer.    PROCEDURE:  Left breast partial mastectomy with wire localization,  excision, sentinel lymph node biopsy with mapping with Neoprobe.    ATTENDING SURGEON:  Benancio Deeds, MD    ANESTHESIA:  General.    OPERATIVE INDICATIONS:  The patient is a 59 year old female with  clinical stage I left-sided upper inner quadrant breast cancer who  underwent uncomplicated placement of wire for localization excision as  well as radionuclide injection for sentinel lymph node mapping with  Neoprobe on the day of procedure.    OPERATIVE COURSE:  After obtaining written consent, the patient was  brought to the operating room where she was placed in supine position.   General anesthetic was induced without complication.  Perioperative  antibiotics were administered.  Universal timeout was performed.  Two  mL of methylene blue dye were injected in retro breast tissues and  massaged for 5 minutes.  She was positioned, padded, prepped and  draped in normal sterile fashion.  A hot area was identified in her  left axilla which was locally anesthetized with 0.25% Marcaine and an  incision was made sharply and deepened through dermis.  Subcutaneous  tissues were divided with electrocautery until the clavipectoral  fascia was identified and divided.  A blue lymphatic was traced to 3  blue lymph nodes, all side by side, which were mildly hot.  These were  removed with careful attention for lymphostasis, hemostasis with  Hemoclips.  Ex vivo counts were 169.  Remaining bed count was 13.   There was no other focus of radiotracer uptake, blue dye, or  pathologically enlarged lymph nodes.  These were sent for  intraoperative frozen section, which were found to be benign.  The  cavity was irrigated and found to be hemostatic, was closed  with  interrupted dermal Vicryl sutures and running subcuticular Monocryl  suture.    Attention was then directed to the partial mastectomy.  A transverse  incision was locally anesthetized with 0.25% Marcaine, incised sharply  through the dermis.  Subcutaneous tissues were divided with cautery to  facilitate exposure of the wire, which was delivered into the cavity.   Cylindrical biopsy specimen was taken to include entirety of the wire.   A single stitch marked superior, double stitch marked deep.   Intraoperative specimen radiograph was performed which showed  containment of the clip; however, the mass was oriented towards the  inferior part of the specimen.  As such, a reexcision of the true  medial, inferolateral and deep margins were taken down to and  including the level of the implant capsule, marking the true  inferolateral and medial margins.  This included an anterior lip.   This was sent for permanent section.  The cavity was irrigated,  inspected for hemostasis, assured with electrocautery.  The  superficial and deep breast fascia layers were closed over top of the  implant with interrupted Vicryl sutures.  The skin was closed with  interrupted dermal Vicryl sutures and running subcuticular Monocryl  suture.  The wounds were cleaned, dried and dressed with Dermabond.      The patient awoke from anesthetic without complication.  All counts  were correct at conclusion of the case.  Dr. Benancio Deeds was present  and scrubbed for the entirety  of the case.      Benancio DeedsMegan  Keyerra Lamere, MD  TR: *n DD: 08/28/2015 11:03 TD: 08/28/2015 11:23 Job#: [id number]     DOC#: [id number]       Signature Line    Electronically Signed on 08/28/2015 04:42 PM EST  ________________________________________________  Herminio CommonsBaker,  Rembert Browe Keeffe-MD, MD

## 2015-08-28 NOTE — Discharge Summary (Signed)
Inpatient Clinical Summary             [facility]  Post-Acute Care Transfer Instructions  PERSON INFORMATION   Name: Brandi Fritz, Brandi Fritz  MRN: [id number]    FIN#: [id number]   PHYSICIANS  Admitting Physician: Herminio Commons  Attending Physician: Herminio Commons   PCP: Luis Abed D.-MD  Discharge Diagnosis:    Comment:       PATIENT EDUCATION INFORMATION  Instructions:             Anesthesia: After Your Surgery  Medication Leaflets:               Follow-up:                          With: Address: When:   [facility] [address]  [facility], [place]  [address]  [phone] Business (1) Within 1 week   Comments:   withen 7-10 days       With: Address: When:   Luis Abed [address]  [place], [place]  [address]  [phone] Business (1) In 2 days                           MEDICATION LIST  Medication Reconciliation at Discharge:         Medications that have not changed  Other Medications  acyclovir (acyclovir 800 mg oral tablet) 1 Tabs Oral (given by mouth) every 12 hours as needed.  Last Dose:____________________  clonazePAM (KlonoPIN 0.5 mg oral tablet) 1 Tabs Oral (given by mouth) 2 times a day as needed., SOUND ALIKE / LOOK ALIKE - VERIFY DRUG   SOUND ALIKE / LOOK ALIKE - VERIFY DRUG  Last Dose:____________________  cyanocobalamin (Vitamin B12) 1 Tabs Oral (given by mouth) every day.  Last Dose:____________________  cyclobenzaprine (Flexeril 10 mg oral tablet) 1 Tabs Oral (given by mouth) 3 times a day as needed., " THIS MEDICATION IS ASSOCIATED WITH AN INCREASED RISK OF FALLS."  Last Dose:____________________  ergocalciferol (Vitamin D) 400 Milligram Oral (given by mouth) every day.  Last Dose:____________________  gabapentin (gabapentin 300 mg oral capsule) 2 Capsules Oral (given by mouth) Once a Day (at bedtime).  Last Dose:____________________  levothyroxine 0.088 Milligram Oral (given by mouth) once a day (in the morning).  Last  Dose:____________________  lutein (Lutein) 1 Capsules Oral (given by mouth) every day.  Taking For and suppl Fibromalic  Last Dose:____________________  multivitamin with minerals (Centrum) 1 Tabs Oral (given by mouth) Monday/Wednesday/Friday.  Last Dose:____________________  norethindrone (norethindrone 5 mg oral tablet) 1 Tabs Oral (given by mouth) Once a Day (at bedtime).  Last Dose:____________________         Patient's Final Home Medication List Upon Discharge:          acyclovir (acyclovir 800 mg oral tablet) 1 Tabs Oral (given by mouth) every 12 hours as needed.  clonazePAM (KlonoPIN 0.5 mg oral tablet) 1 Tabs Oral (given by mouth) 2 times a day as needed., SOUND ALIKE / LOOK ALIKE - VERIFY DRUG   SOUND ALIKE / LOOK ALIKE - VERIFY DRUG  cyanocobalamin (Vitamin B12) 1 Tabs Oral (given by mouth) every day.  cyclobenzaprine (Flexeril 10 mg oral tablet) 1 Tabs Oral (given by mouth) 3 times a day as needed., " THIS MEDICATION IS ASSOCIATED WITH AN INCREASED RISK OF FALLS."  ergocalciferol (Vitamin D) 400 Milligram Oral (given  by mouth) every day.  gabapentin (gabapentin 300 mg oral capsule) 2 Capsules Oral (given by mouth) Once a Day (at bedtime).  levothyroxine 0.088 Milligram Oral (given by mouth) once a day (in the morning).  lutein (Lutein) 1 Capsules Oral (given by mouth) every day.  Taking For and suppl Fibromalic  multivitamin with minerals (Centrum) 1 Tabs Oral (given by mouth) Monday/Wednesday/Friday.  norethindrone (norethindrone 5 mg oral tablet) 1 Tabs Oral (given by mouth) Once a Day (at bedtime).         Comment:       ORDERS         Order Name Order Details   Discharge Patient 08/28/15 10:59:00 EST

## 2015-08-28 NOTE — Nursing Note (Signed)
Medication Administration Follow Up-Text       Medication Administration Follow Up Entered On:  08/28/2015 11:39 EST    Performed On:  08/28/2015 11:41 EST by Barbera SettersVINING, RN, LISA F      Intervention Information:     hydromorphone  Performed by Barbera SettersVINING, RN, LISA F on 08/28/2015 11:26:00 EST       hydromorphone,0.5mg   IV Push,Hand, Right,other (see comment)       Medication Effectiveness Evaluation   Medication Effective :   Yes   Medication Response :   Symptoms improved   Barbera SettersVINING, RN, LISA F - 08/28/2015 11:39 EST

## 2015-08-28 NOTE — Procedures (Signed)
IntraOp Record - [facility]             IntraOp Record - [facility] Summary                                                                   Primary Physician:        Gabriel Rung Keeffe-MD    Case Number:              [id number]    [id number] Date/Time:      08/28/15 11:00:06    Pt. Name:                 Brandi Fritz, Brandi Fritz    D.O.B./Sex:               1956/01/17    Female    Med Rec #:                [id number]    Physician:                Gabriel Rung Keeffe-MD    Financial #:              [id number]    Pt. Type:                 S    Room/Bed:                 /    Admit/Disch:              08/28/15 07:10:00 -    Institution:       [facility] - Case Times                                                                                                         Entry 1                                                                                                          Patient      In Room Time             08/28/15 09:48:00               Out Room Time                   08/28/15 11:00:00    Anesthesia     Procedure  Start Time               08/28/15 10:01:00               Stop Time                       08/28/15 10:56:00    Last Modified By:         Eliane Decree, MELANIE L                              08/28/15 11:00:04      [facility] - Case Times Audit                                                                          08/28/15 11:00:04         Owner: Pryor Ochoa                               Modifier: CARLME                                                        <+> 1         Out Room Time     08/28/15 10:56:48         Owner: Pryor Ochoa                               Modifier: CARLME                                                        <+> 1         Stop Time     08/28/15 10:01:51         Owner: Pryor Ochoa                               Modifier: CARLME                                                        <+> 1         Start Time        [facility] - Safety Checklist - Sign In  Pre-Care  Text:            A.10 Confirms patient identity A.20 Verifies operative procedure, surgical site, and laterality A.20.1 Verifies           consent for planned procedure A.30 Verifies allergies                              Entry 1                                                                                                          History/Physical on       Yes                             Procedure Consent               Yes    Chart                                                     on Chart     Site Marked (if           Yes    applicable)     Last Modified By:         Isaiah Blakes RN, MELANIE L                              08/28/15 09:57:46    Post-Care Text:            E.30 Evaluates verification process for correct patient, site, side, and level surgery      [facility] - Case Attendance                                                                                                    Entry 1                         Entry 2                         Entry 3                                          Case Attendee  Rawls,  Glean Hess,  Megan Keeffe-MD         Lisette Grinder, RN, [facility]    Role Performed            Anesthesiologist                Surgeon Primary                 Circulator    Time In                   08/28/15 09:48:00               08/28/15 09:48:00               08/28/15 09:48:00    Time Out     Procedure                 Breast Lumpectomy with          Breast Lumpectomy with          Breast Lumpectomy with                              Sentinel Node Bx                Sentinel Node Bx                Sentinel Node Bx    Last Modified By:         Lisette Grinder RN, Loree Fee, RN, Loree Fee, RN, MELANIE L                              08/28/15 10:40:05               08/28/15 10:40:05               08/28/15 10:40:05    General Comments:            Forbes Cellar - scrub      [facility] - Case Attendance Audit                                                                      08/28/15 10:40:05         Owner: Pryor Ochoa                               Modifier: CARLME                                                        <+> 1         Time In        <+> 1         Procedure        <+>  2         Time In        <+> 2         Procedure            3     <+> Time In            3     <*> Procedure                              Breast Lumpectomy with Sentinel Node Bx     08/28/15 08:49:01         Owner: CARLME                               Modifier: CARLME                                                            3     <*> Case Attendee                          Lisette Grinder, RN, MELANIE L            3     <*> Case Attendee                          CARLSON, RN, MELANIE L            3     <*> Role Performed            3     <*> Role Performed            3     <*> Procedure            3     <*> Procedure        [facility] - Skin Assessment                                                                          Pre-Care Text:            A.240 Assesses baseline skin condition Im.120 Implements protective measures to prevent skin or tissue injury           due to mechanical sources  Im.280.1 Implements progective measures to prevent skin or tissue injury due to           thermal sources Im.360 Monitors for signs and symptons of infection                              Entry 1  Skin Integrity            Intact    Last Modified By:         Isaiah Blakes RN, MELANIE L                              08/28/15 09:57:38    Post-Care Text:            E.10 Evaluates for signs and symptoms of physical injury to skin and tissue E.270 Evaluate tissue perfusion           O.60 Patient is free from signs and symptoms of injury caused by extraneous objects   O.210 Patinet's tissue           perfusion is consistent with or improved from baseline levels      [facility] - Patient Positioning                                                                       Pre-Care Text:            A.240 Assesses baseline skin condition A.280 Identifies baseline musculoskeletal status A.280.1 Identifies           physical alterations that require additional precautions for procedure-specific positioning A.510.8 Maintains           patient's dignity and privacy Im.120 Implements protective measures to prevent skin/tissue injury due to           mechanical sources Im.40 Positions the patient Im.80 Applies safety devices                              Entry 1                                                                                                          Procedure                 Breast Lumpectomy with          Body Position                   Supine                              Sentinel Node Bx    Left Arm Position         Extended on Padded Arm          Right Arm Position              Extended on Padded Arm  Board w/Security Strap                                          Board w/Security Strap    Left Leg Position         Extended, Pillow Under          Right Leg Position              Extended, Pillow Under                              Knee                                                            Knee    Feet Uncrossed            Yes                             Pressure Points                 Yes                                                              Checked     Positioning Device        Foam Padding, Gel Pad,          Positioned By                   Rawls,  James-MD,                              Head Rest Foam, Pillow,                                         CARLSON, RN, MELANIE L,                              Safety Strap                                                    Baker,  Megan Keeffe-MD    Outcome Met (O.80)        Yes    Last Modified By:         Lisette Grinder RN, MELANIE L                              08/28/15 08:54:29    Post-Care Text:            E.10  Evaluates for signs and symptoms of physical injury to skin and  tissue E.290 Evaluates musculoskeletal           status O.80 Patient is free from signs and symptoms of injury related to positioning O.120 the patient is free           from signs and symptoms of injury related to transfer/transport  O.250 Patient's musculoskeletal status is           maintained at or improved from baseline levels      [facility] - Skin Prep                                                                                Pre-Care Text:            A.30 Verifies allergies A.20 Verifies procedure, surgical site, and laterality A.510.8 Maintains paritnet's           dignity and privacy Im.270 Performs Skin Preparation Im.270.1 Implements protective measures to prevent skin           and tissue injury due to chemical sources  A.300.1 Protects from cross-contamination                              Entry 1                                                                                                          Hair Removal     Skin Prep      Prep Agents (Im.270)     Chlorhexidine Gluconate         Prep Area (Im.270)              Breast                              2% w/Alcohol     Prep Area Details        Left                            Prep By                         Gabriel Rung Keeffe-MD    Outcome Met (O.100)       Yes    Last Modified By:         Isaiah Blakes RN, MELANIE L                              08/28/15 08:54:45    Post-Care Text:  E.10 Evaluates for signs and symptoms of physical injury to skin and tissue O.100 Patient is free from signs           and symptoms of chemical injury  O.740 The patient's right to privacy is maintained      [facility] - Counts Initial and Final                                                                 Pre-Care Text:            A.20 Verifies operative procedure, sugical site, and laterality A.20.2 Assesses the risk for unintended           retained foreign body Im.20 Performs required counts                              Entry 1                                                                                                           Initial Counts      Initial Counts           CARLSON, RN, MELANIE L          Items included in               Sponges, Sharps     Performed By                                             the Initial Count     Final Counts      Final Counts             CARLSON, RN, MELANIE L          Final Count Status              Correct     Performed By      Items Included in        Sponges, Sharps     Final Count     Outcome Met (O.20)        Yes    Last Modified By:         Lisette Grinder RN, MELANIE L                              08/28/15 10:47:38    Post-Care Text:            E.50 Evaluates results of the surgical count O.20 Patient is free from unintended retained foreign objects    General Comments:            Morrie Sheldon  Shaffer - scrub      [facility] - Counts Initial and Final Audit                                                            08/28/15 10:47:38         Owner: CARLME                               Modifier: CARLME                                                        <+> 1         Final Count Status        <+> 1         Outcome Met (O.20)        [facility] - General Case Data                                                                        Pre-Care Text:            A.350.1 Classifies surgical wound                              Entry 1                                                                                                          Case Information      ASA Class                2                               Case Level                      Level 3     OR                       MP 01                           Specialty  General (SN)     Wound Class              1-Clean    Preop Diagnosis           BREAST CA    Last Modified By:         Isaiah Blakes RN, MELANIE L                              08/28/15 09:58:58    Post-Care Text:            O.760 Patient receives consistent and comparable care regardless of the setting      [facility] - Fire Risk  Assessment                                                                                               Entry 1                                                                                                          Fire Risk                 Alcohol Based Prep              Fire Risk Score                 3    Assessment: If            Solution, Surgical Site    checked, checkmark        Above Xiphoid, Ignition    = 1 point                 Source In Use    Last Modified By:         Isaiah Blakes, RN, MELANIE L                              08/28/15 08:51:03      [facility] - Safety Checklist - Time Out                                                              Pre-Care Text:            A.10 Confirms patient identity A.20 Verifies operative procedure, surgical site, and laterality A.20.1 Verifies           consent  for planned procedure A.30 Verifies allergies                              Entry 1                                                                                                          Surgical/Procedure        Yes                             Time Out Complete               08/28/15 10:00:00    Team confirms     correct patient,     correct site and     correct procedure     Last Modified By:         Isaiah Blakes RN, MELANIE L                              08/28/15 10:01:47    Post-Care Text:            E.30 Evaluates verification process for correct patient, site, side, and level surgery      [facility] - Cautery                                                                                  Pre-Care Text:            A.240 Assesses baseline skin condition A280.1 Identifies baseline musculoskeletal status Im.50 Implements           protective measures to prevent injury due to electrical sources  Im.60 Uses supplies and equipment within safe           parameters Im.80 Applies safety devices                              Entry 1                                                                                                           ESU Type  GENERATOR                       Identification                  [id number]                              COVIDIEN/[facility]              Number     Coag Setting (watts)      35                              Cut Setting (watts)             35    Grounding Pad             Yes                             Grounding Pad Site              Thigh, right    Needed?     Grounding [employer], RN, MELANIE L          ESU Comment                     [phone] X    Applied By     Outcome Met (O.10)        Yes    Last Modified By:         Isaiah Blakes RN, MELANIE L                              08/28/15 08:49:58    Post-Care Text:            E.10 Evaluates for signs and symptoms of physical injury to skin and tissue O.10 Patient is free from signs and           symptoms of injury related to thermal sources  O.70 Patient is free from signs and symptoms of electrical injury      [facility] - Patient Care Devices                                                                     Pre-Care Text:            A.200 Assesses risk for normothermia regulation A.40 Verifies presence of prosthetics or corrective devices           Im.280 Implements thermoregulation measures Im.60 Uses supplies and equipment within safe parameters                              Entry 1                         Entry 2  Equipment Type            BAIR HUGGER                     MACHINE SEQUENTIAL                                                              COMPRESSION    SCD Sleeve Site                                           Legs Bilateral    Equipment/Tag Number      [id number]                          [id number]    Initiated Pre                                             Yes    Induction     Last Modified By:         Lisette Grinder RN, Loree Fee, RN, MELANIE L                              08/28/15 08:50:25               08/28/15 08:50:25    Post-Care Text:             E.10 Evaluates signs and symptoms of physical injury to skin and tissue O.60 Patient is free from signs and           symptoms of injury caused by extraneous objects      [facility] - Medications                                                                              Pre-Care Text:            A.10 Confirms patient identity A.30 Verifies allergies Im.220 Administers prescribed medications Im.220.2           Administers prescribed antibiotic therapy as ordered                              Entry 1                         Entry 2  Time Administered         08/28/15 10:01:00               08/28/15 09:55:00    Medication                BUPIVACAINE HCL 0.25%           METHYLENE BLUE 1%                              MPF INJECTION 0.25% 30ML        INJECTION 1%  1ML    Route of Admin            Local Injection                 Local Injection    Dose/Volume               8 mL                            4 mL    (include amount and     unit of measure)     Site                      Breast                          Breast    Site Detail               Left                            Left    Administered By           Jolaine Artist,  Megan Keeffe-MD    Outcome Met (O.130)       Yes                             Yes    Last Modified By:         Isaiah Blakes RN, Elmarie Mainland, RN, MELANIE L                              08/28/15 10:13:56               08/28/15 10:03:10    Post-Care Text:            E.20 Evaluates response to medications O.130 Patient receives appropriately administerd medication(s)    General Comments:            2 cc methylene blue mixed with 2 cc 0.9%NS for sentinel node biopsy      [facility] - Medications Audit                                                                         08/28/15 10:13:56  Owner: CARLME                               Modifier: CARLME                                                             1     <*> Medication                             BUPIVACAINE HCL 0.25% MPF INJECTION 0.25% 30ML            1     <*> Medication                             BUPIVACAINE HCL 0.25% MPF INJECTION 0.25% 30ML            1     <*> Medication                             BUPIVACAINE HCL 0.25% MPF INJECTION 0.25% 30ML            1     <*> Medication                             BUPIVACAINE HCL 0.25% MPF INJECTION 0.25% 30ML            1     <*> Medication                             BUPIVACAINE HCL 0.25% MPF INJECTION 0.25% 30ML            1     <*> Dose/Volume (include amount and        3 mL                      unit of measure)            1     <*> Dose/Volume (include amount and        3 mL                      unit of measure)            1     <*> Dose/Volume (include amount and        3 mL                      unit of measure)        [facility] - Specimens  Entry 1                         Entry 2                         Entry 3                                          Description               left axillary sentinel          left breast lumpectomy:         re-excision left breast                              lymph node #1                   single-superior,                lumpectomy cavity:                                                              double-deep                     single short-inferior,                                                                                              single long-lateral,                                                                                              double short-medial    Specimen Type             Frozen                          Routine                         Routine    Date/Time in                                              08/28/15 10:31:00  08/28/15 10:36:00    Formalin (for     breast tissue only)     Last Modified By:         Isaiah Blakes  RN, Elmarie Mainland, RN, Elmarie Mainland, RN, MELANIE L                              08/28/15 10:08:34               08/28/15 10:39:18               08/28/15 10:39:18      [facility] - Specimens Audit                                                                           08/28/15 10:39:18         Owner: Jeoffrey Massed                               Modifier: CARLME                                                        <+> 2         Description        <+> 2         Specimen Type        <+> 2         Date/Time in Formalin (for breast                      tissue only)        <+> 3         Description        <+> 3         Specimen Type        <+> 3         Date/Time in Formalin (for breast                      tissue only)        [facility] - Dressing/Packing                                                                         Pre-Care Text:            A.350 Assesses susceptibility for infection Im.250 Administers care to invasive devices Im.290 Administer care           to wound sites  Im.300 Implements aseptic technique  Entry 1                                                                                                          Site                      Breast                          Site Details                    Left    Dressing Item     Details      Dressing Item            Liquid Bandage     (Im.290)     Last Modified By:         Isaiah Blakes, RN, MELANIE L                              08/28/15 08:50:55    Post-Care Text:            E.320 Evaluate factors associted with increased risk for postoperative infection at the completion of the           procedure O.200 Patient's wound perfusion is consistent with or improved from baseline levels  O.Patient is           free from signs and symptoms of infection      [facility] - Procedures                                                                               Pre-Care Text:            A.20 Verifies operative procedure, surgical site,  and laterality Im.150 Develops individualized plan of care                              Entry 1                                                                                                          Procedure     Description      Procedure  Breast Lumpectomy with          Surgical Procedure              BREAST LUMPECTOMY W/                              Sentinel Node Bx with           Text                            SLN BX LEFT                              or without Axillary                              Dissection    Primary Procedure         Yes                             Primary Surgeon                 Gabriel Rung [facility]    Start                     08/28/15 10:01:00               Stop                            08/28/15 10:56:00    Anesthesia Type           General                         Surgical Service                General (SN)    Wound Class               1-Clean    Last Modified By:         Isaiah Blakes RN, MELANIE L                              08/28/15 10:57:01    Post-Care Text:            O.730 The patinet's care is consistent with the individualized perioperative plan of care      [facility] - Procedures Audit                                                                          08/28/15 10:57:01         Owner: CARLME                               Modifier: CARLME  1     <*> Procedure                              Breast Lumpectomy with Sentinel Node Bx with or without Axillary                                                             Dissection            1     <+> Stop            1     <*> Surgical Procedure Text                BREAST LUMPECTOMY W/ SLN BX POSS ALND LEFT        [facility] - Safety Checklist - Sign Out                                                              Pre-Care Text:            Im.330 Manages specimen handling and disposition                              Entry 1                                                                                                           Patient Safety            Yes    Communication Guide     Used Throughout Case     Last Modified By:         Isaiah Blakes RN, MELANIE L                              08/28/15 10:40:16    Post-Care Text:            E.800 Ensures continuity of care E.50 Evaluates results of the surgical count O.30 Patient's procedure is           performed on the correct site, side, and level O.50 patient's current status is communicated throughout the           continuum of care O.40 Patient's specimen(s) is managed in the appropriate manner      [facility] - Transfer  Entry 1                                                                                                          Transferred By            Isaiah Blakes, RN, MELANIE Carlean Jews,         Via                             Stretcher                              Rawls,  James-MD    Post-op Destination       PACU    Skin Assessment      Condition                Intact    Last Modified By:         Isaiah Blakes RN, MELANIE L                              08/28/15 10:03:51      Case Comments                                                                                         <None>              Finalized By: Isaiah Blakes RN, MELANIE L      Document Signatures                                                                             Signed By:           Isaiah Blakes RN, MELANIE L 08/28/15 11:00

## 2015-08-28 NOTE — Progress Notes (Signed)
Pharmacy Clinical Interventions - Text       Pharmacy Clinical Interventions Entered On:  08/28/2015 8:00 EST    Performed On:  08/28/2015 8:00 EST by Seabron SpatesBISHOP,  TRACY D               Interventions   Intervention Type Pharmacy :   Therapeutic duplication, Unnecessary order   Associated Order(s) Pharmacy :   entered incorrectly   Pharmacy Order Initiated By :   Pharmacist   Clinical Importance Pharmacy :   Potentially minor   Pharmacist Intervention Time :   < 1 Minute   BISHOP,  TRACY D - 08/28/2015 8:00 EST

## 2015-08-28 NOTE — Nursing Note (Signed)
Medication Administration Follow Up-Text       Medication Administration Follow Up Entered On:  08/28/2015 11:29 EST    Performed On:  08/28/2015 11:27 EST by Barbera SettersVINING, RN, LISA F      Intervention Information:     hydromorphone  Performed by Barbera SettersVINING, RN, LISA F on 08/28/2015 11:12:00 EST       hydromorphone,0.5mg   IV Push,Hand, Right,other (see comment)       Medication Effectiveness Evaluation   Medication Effective :   No   Barbera SettersVINING, RN, LISA F - 08/28/2015 11:29 EST

## 2015-08-28 NOTE — Discharge Summary (Signed)
 Inpatient Patient Summary       ;        [facility]  [address] [place], [place] [address]  [phone]  Patient Discharge Instructions     Name: Brandi Fritz, Brandi Fritz  Current Date: 08/28/2015 11:24:53  DOB: 05/26/1956 [id number] FIN: < %ALIASEFIN NBR%>  Patient Address: [address] [facility] [place] [address]  Patient Phone: [phone]  Primary Care Provider:  Name: Carmelia Brasil D.-MD  Phone: [phone]   Immunizations Provided:      Discharge Diagnosis:   Discharged To:                               TO, ANTICIPATED%>  Home Treatments:                           TREATMENTS, ANTICIPATED%>  Devices/Equipment:                      EQUIPMENT REHAB%>  Post Hospital Services:                  HOSPITAL SERVICES%>  Professional Skilled Services: SKILLED SERVICES%>  Therapist, sports and Community Resources:                SERV AND COMM RES, ANTICIPATED%>  Mode of Discharge Transportation:                              TRANSPORTATION%>  Discharge Orders:         Discharge Patient 08/28/15 10:59:00 EST         Comment:      Medications  During the course of your visit, your medication list was updated with the most current information. The details of those changes are reflected below:         Medications that have not changed  Other Medications  acyclovir (acyclovir 800 mg oral tablet) 1 Tabs Oral (given by mouth) every 12 hours as needed.  Last Dose:____________________  clonazePAM (KlonoPIN 0.5 mg oral tablet) 1 Tabs Oral (given by mouth) 2 times a day as needed., SOUND ALIKE / LOOK ALIKE - VERIFY DRUG   SOUND ALIKE / LOOK ALIKE - VERIFY DRUG  Last Dose:____________________  cyanocobalamin (Vitamin B12) 1 Tabs Oral (given by mouth) every day.  Last Dose:____________________  cyclobenzaprine (Flexeril 10 mg oral tablet) 1 Tabs Oral (given by mouth) 3 times a day as needed.,  THIS MEDICATION IS ASSOCIATED WITH AN INCREASED RISK OF FALLS.  Last Dose:____________________  ergocalciferol (Vitamin D) 400  Milligram Oral (given by mouth) every day.  Last Dose:____________________  gabapentin (gabapentin 300 mg oral capsule) 2 Capsules Oral (given by mouth) Once a Day (at bedtime).  Last Dose:____________________  levothyroxine 0.088 Milligram Oral (given by mouth) once a day (in the morning).  Last Dose:____________________  lutein (Lutein) 1 Capsules Oral (given by mouth) every day.  Taking For and suppl Fibromalic  Last Dose:____________________  multivitamin with minerals (Centrum) 1 Tabs Oral (given by mouth) Monday/Wednesday/Friday.  Last Dose:____________________  norethindrone (norethindrone 5 mg oral tablet) 1 Tabs Oral (given by mouth) Once a Day (at bedtime).  Last Dose:____________________         [facility] would like to thank you for allowing us  to assist you with your healthcare needs. The following includes patient education  materials and information regarding your injury/illness.     Brandi Fritz has been given the following list of follow-up instructions, prescriptions, and patient education materials:  Follow-up Instructions             With: Address: When:   [address]  [place], [place]  [address]  [phone] Business (1) Within 1 week   Comments:   withen 7-10 days       With: Address: When:   Brandi Fritz [address] [place]. [place], [place]  [address]  [phone] Business (1) In 2 days                       It is important to always keep an active list of medications available so that you can share with other providers and manage your medications appropriately. As an additional courtesy, we are also providing you with your final active medications list that you can keep with you.           acyclovir (acyclovir 800 mg oral tablet) 1 Tabs Oral (given by mouth) every 12 hours as needed.  clonazePAM (KlonoPIN 0.5 mg oral tablet) 1 Tabs Oral (given by mouth) 2 times a day as needed., SOUND ALIKE / LOOK ALIKE - VERIFY DRUG   SOUND ALIKE / LOOK ALIKE -  VERIFY DRUG  cyanocobalamin (Vitamin B12) 1 Tabs Oral (given by mouth) every day.  cyclobenzaprine (Flexeril 10 mg oral tablet) 1 Tabs Oral (given by mouth) 3 times a day as needed.,  THIS MEDICATION IS ASSOCIATED WITH AN INCREASED RISK OF FALLS.  ergocalciferol (Vitamin D) 400 Milligram Oral (given by mouth) every day.  gabapentin (gabapentin 300 mg oral capsule) 2 Capsules Oral (given by mouth) Once a Day (at bedtime).  levothyroxine 0.088 Milligram Oral (given by mouth) once a day (in the morning).  lutein (Lutein) 1 Capsules Oral (given by mouth) every day.  Taking For and suppl Fibromalic  multivitamin with minerals (Centrum) 1 Tabs Oral (given by mouth) Monday/Wednesday/Friday.  norethindrone (norethindrone 5 mg oral tablet) 1 Tabs Oral (given by mouth) Once a Day (at bedtime).      Take only the medications listed above. Contact your doctor prior to taking any medications not on this list.  Discharge instructions, if any, will display below     Instructions for Diet: INSTRUCTIONS FOR DIET%>A Healthy Diet  Instructions for Supplements: SUPPLEMENT INSTRUCTIONS%>  Instructions for Activity: INSTRUCTIONS FOR ACTIVITY%>May shower, Other: See Dr. Earlyne Detail post op breast instruction sheet  Instructions for Wound Care: INSTRUCTIONS FOR WOUND CARE%>Keep open to air, May shower, Other: Report elevated temp over 101, drainage  or redness from wound     Medication leaflets, if any, will display below         Patient education materials, if any, will display below        Discharge Instructions: After Your Surgery   Youve just had surgery. During surgery you were given medicine called anesthesia to keep you relaxed and free of pain. After surgery you may have some pain or nausea. This is common. Here are some tips for feeling better and getting well after surgery.       Stay on schedule with your medication.    Going home   Your doctor or nurse will show you how to take care of yourself when you go home. He or she  will also answer your questions. Have an adult family member or  friend drive you home. For the first 24 hours after your surgery:    Do not drive or use heavy equipment.    Do not make important decisions or sign legal papers.    Do not drink alcohol.    Have someone stay with you, if needed. He or she can watch for problems and help keep you safe.   Be sure to go to all follow-up visits with your doctor. And rest after your surgery for as long as your doctor tells you to.   Coping with pain   If you have pain after surgery, pain medicine will help you feel better. Take it as told, before pain becomes severe. Also, ask your doctor or pharmacist about other ways to control pain. This might be with heat, ice, or relaxation. And follow any other instructions your surgeon or nurse gives you.   Tips for taking pain medicine   To get the best relief possible, remember these points:    Pain medicines can upset your stomach. Taking them with a little food may help.    Most pain relievers taken by mouth need at least 20 to 30 minutes to start to work.    Taking medicine on a schedule can help you remember to take it. Try to time your medicine so that you can take it before starting an activity. This might be before you get dressed, go for a walk, or sit down for dinner.    Constipation is a common side effect of pain medicines. Call your doctor before taking any medicines such as laxatives or stool softeners to help ease constipation. Also ask if you should skip any foods. Drinking lots of fluids and eating foods such as fruits and vegetables that are high in fiber can also help. Remember, do not take laxatives unless your surgeon has prescribed them.    Drinking alcohol and taking pain medicine can cause dizziness and slow your breathing. It can even be deadly. Do not drink alcohol while taking pain medicine.    Pain medicine can make you react more slowly to things. Do not drive or run machinery while taking pain  medicine.   Your health care provider may tell you to take acetaminophen to help ease your pain. Ask him or her how much you are supposed to take each day. Acetaminophen or other pain relievers may interact with your prescription medicines or other over-the-counter (OTC) drugs. Some prescription medicines have acetaminophen and other ingredients. Using both prescription and OTC acetaminophen for pain can cause you to overdose. Read the labels on your OTC medicines with care. This will help you to clearly know the list of ingredients, how much to take, and any warnings. It may also help you not take too much acetaminophen. If you have questions or do not understand the information, ask your pharmacist or health care provider to explain it to you before you take the OTC medicine.   Managing nausea   Some people have an upset stomach after surgery. This is often because of anesthesia, pain, or pain medicine, or the stress of surgery. These tips will help you handle nausea and eat healthy foods as you get better. If you were on a special food plan before surgery, ask your doctor if you should follow it while you get better. These tips may help:    Do not push yourself to eat. Your body will tell you when to eat and how much.    Start off with clear  liquids and soup. They are easier to digest.    Next try semi-solid foods, such as mashed potatoes, applesauce, and gelatin, as you feel ready.    Slowly move to solid foods. Dont eat fatty, rich, or spicy foods at first.    Do not force yourself to have 3 large meals a day. Instead eat smaller amounts more often.    Take pain medicines with a small amount of solid food, such as crackers or toast, to avoid nausea.       Call your surgeon if.    You still have pain an hour after taking medicine. The medicine may not be strong enough.    You feel too sleepy, dizzy, or groggy. The medicine may be too strong.    You have side effects like nausea, vomiting, or skin  changes, such as rash, itching, or hives.        If you have obstructive sleep apnea   You were given anesthesia medicine during surgery to keep you comfortable and free of pain. After surgery, you may have more apnea spells because of this medicine and other medicines you were given. The spells may last longer than usual.    At home:    Keep using the continuous positive airway pressure (CPAP) device when you sleep. Unless your health care provider tells you not to, use it when you sleep, day or night. CPAP is a common device used to treat obstructive sleep apnea.    Talk with your provider before taking any pain medicine, muscle relaxants, or sedatives. Your provider will tell you about the possible dangers of taking these medicines.      [address] [employer] [address] [place], [place] [address]. All rights reserved. This information is not intended as a substitute for professional medical care. Always follow your healthcare professional's instructions.               IS IT A STROKE?  Act FAST and Check for these signs:     FACE                  Does the face look uneven?     ARM                    Does one arm drift down?     SPEECH             Does their speech sound strange?     TIME                   Call 9-1-1 at any sign of stroke  Heart Attack Signs  Chest discomfort: Most heart attacks involve discomfort in the center of the chest and lasts more than a few minutes, or goes away and comes back. It can feel like uncomfortable pressure, squeezing, fullness or pain.  Discomfort in upper body: Symptoms can include pain or discomfort in one or both arms, back, neck, jaw or stomach.  Shortness of breath: With or without discomfort.  Other signs: Breaking out in a cold sweat, nausea, or lightheaded.  Remember, MINUTES DO MATTER. If you experience any of these heart attack warning signs, call 9-1-1 to get immediate medical attention!             Yes - Patient/family/caregiver demonstrates  understanding of instructions given  ______________________________ ___________ ___________________ ___________  Patient/Family/ Caregiver Signature Date/Time          Provider Signature Date/Time

## 2015-12-10 NOTE — H&P (Signed)
History and Physical    [facility]  Mitsuko Luera G. Sonia Bromell, MD  Admission Date: 12/16/2015    HISTORY OF PRESENT ILLNESS:  This patient is a 60 year old white  female gravida 2, para 2, who is currently menopausal with a history  for symptomatic fibroid uterus with dysfunctional uterine bleeding and  a recent history for breast cancer who is being admitted for  laparoscopic-assisted vaginal hysterectomy with bilateral  salpingo-oophorectomy.  She has had intermittent persistent  dysfunctional bleeding for which  she underwent a hysteroscopy and D  in November 2016 which revealed benign appearing endometrial polyps  and benign proliferative endometrium.  In the interim, she was  diagnosed with breast cancer stage IA and is status post lumpectomy  and lymph node removal.  She has been getting XRT and is interested in  definitive treatment for her dysfunctional bleeding with a known  fibroid uterus.    PAST MEDICAL HISTORY:  Significant for breast cancer which was  recently diagnosed hypothyroidism, fibromyalgia and depression.    PAST SURGICAL HISTORY:  Significant for recent D  2016.  Recent breast lumpectomy and lymph node dissection in 2017.   She has had previous breast implants, previous _____removed times 2,  and previous cesarean sections times 2.    PAST OBSTETRIC HISTORY:  Previous cesarean sections times 2 due to  failure to progress.    GYNECOLOGIC HISTORY:  Significant also for the fibroid uterus,  dysfunctional bleeding.  She has had negative Pap smears, but did have  HPV high risk positive Pap smear in 2014.  Her most recent Pap smear  was negative.    MEDICATIONS:  Arimidex, Adderall, clonazepam, fluconazole as needed,  gabapentin at bedtime, Protonix as needed, Synthroid 88 mcg daily,  trazodone as needed and Ultram as needed.    ALLERGIES:  She has no known allergies.    REVIEW OF SYSTEMS:  Unremarkable.    PHYSICAL EXAMINATION:  VITAL SIGNS:  She is afebrile with stable vital  signs.  She is alert and  oriented.  Her HEENT exam is unremarkable.   Chest is clear to auscultation.  HEART:  Regular rate and rhythm.   ABDOMEN:  Soft and nontender.  PELVIC EXAM:  Confirms a slightly  enlarged fibroid uterus with no adnexal masses noted and no active  bleeding.  EXTREMITIES:  Unremarkable.  NEUROLOGICAL EXAM:   Unremarkable.    IMPRESSION:  Dysfunctional uterine bleeding, fibroid uterus, history  for breast cancer.    PLAN:  For laparoscopic-assisted vaginal hysterectomy with bilateral  salpingo-oophorectomy to be performed on 12/16/2015.      Reece Packerebecca G Thora Scherman, MD  TR: *n DD: 12/10/2015 13:57 TD: 12/10/2015 14:38 Job#: [id number]  \\[id number]\\DOC#: [id number]  \\[id number]\\  Signature Line    Electronically Signed on 12/14/2015 07:57 AM EDT  ________________________________________________  Lissa MoralesBAIRD-MD,  Evoleth Nordmeyer G

## 2015-12-16 NOTE — Nursing Note (Signed)
Medication Administration Follow Up-Text       Medication Administration Follow Up Entered On:  12/16/2015 10:39 EDT    Performed On:  12/16/2015 10:54 EDT by Milagros EvenerHUNNICUTT, RN, Foy GuadalajaraSUZANNE R      Intervention Information:     hydromorphone  Performed by Milagros EvenerHUNNICUTT, RN, Foy GuadalajaraSUZANNE R on 12/16/2015 10:39:00 EDT       hydromorphone,0.5mg   IV Push,Forearm, Mid Right,other (see comment)       Medication Effectiveness Evaluation   Medication Administration Reason :   Pain   Medication Response :   Continue to observe for symptoms   Milagros EvenerHUNNICUTT, RFoy Guadalajara, SUZANNE R - 12/16/2015 10:39 EDT

## 2015-12-16 NOTE — Nursing Note (Signed)
Medication Administration Follow Up-Text       Medication Administration Follow Up Entered On:  12/16/2015 19:12 EDT    Performed On:  12/16/2015 19:00 EDT by Lyn HollingsheadALEXANDER, RN, HALEY E      Intervention Information:     acetaminophen-hydrocodone  Performed by Kem BoroughsBeard, RN, Leeanne J on 12/16/2015 17:09:00 EDT       HYDROcodone-acetaminophen,2tabs  Oral,other (see comment)       Medication Effectiveness Evaluation   Medication Administration Reason :   Pain   Medication Effective :   Yes   Medication Response :   Symptoms improved   Lyn HollingsheadLEXANDER, RN, HALEY E - 12/16/2015 19:12 EDT

## 2015-12-16 NOTE — Nursing Note (Signed)
Medication Administration Follow Up-Text       Medication Administration Follow Up Entered On:  12/16/2015 21:56 EDT    Performed On:  12/16/2015 21:56 EDT by Lyn HollingsheadALEXANDER, RN, HALEY E      Intervention Information:     ibuprofen  Performed by Lyn HollingsheadALEXANDER, RN, HALEY E on 12/16/2015 20:37:00 EDT       ibuprofen,800mg   Oral,mild pain (1-3)       Medication Effectiveness Evaluation   Medication Administration Reason :   Pain   Medication Effective :   Yes   Medication Response :   Symptoms improved   Lyn HollingsheadLEXANDER, RN, HALEY E - 12/16/2015 21:56 EDT

## 2015-12-16 NOTE — Procedures (Signed)
 IntraOp Record - [facility]             IntraOp Record - [facility] Summary                                                                   Primary Physician:        ELVIS ASBERRY MATSU    Case Number:              [id number]    Finalized Date/Time:      12/16/15 10:17:21    Pt. Name:                 Brandi Fritz, Brandi Fritz    D.O.B./Sex:               20-Jun-1956    Female    Med Rec #:                [id number]    Physician:                ELVIS ASBERRY MATSU    Financial #:              [id number]    Pt. Type:                 R    Room/Bed:                 /    Admit/Disch:              12/16/15 06:26:00 -    Institution:       DQNM - Case Times                                                                                                         Entry 1                                                                                                          Patient      In Room Time             12/16/15 08:04:00               Out Room Time                   12/16/15 10:08:00    Anesthesia     Procedure  Start Time               12/16/15 08:19:00               Stop Time                       12/16/15 10:05:00    Last Modified By:         Fairy OBIE Delon JONELLE                              12/16/15 10:14:50      SFOR - Case Times Audit                                                                          12/16/15 10:14:50         Owner: ALI                               Modifier: DESMJE                                                        <+> 1         Out Room Time        <+> 1         Stop Time     12/16/15 08:39:03         Owner: ALI                               Modifier: DESMJE                                                        <+> 1         Start Time        SFOR - Safety Checklist - Sign In                                                               Pre-Care Text:            A.10 Confirms patient identity A.20 Verifies operative procedure, surgical site, and laterality A.20.1 Verifies           consent  for planned procedure A.30 Verifies allergies                              Entry 1  History/Physical on       Yes                             Procedure Consent               Yes    Chart                                                     on Chart     Site Marked (if           No    applicable)     Last Modified By:         Fairy OBIE Delon JONELLE                              12/16/15 08:40:04    Post-Care Text:            E.30 Evaluates verification process for correct patient, site, side, and level surgery      SFOR - Case Attendance                                                                                                    Entry 1                         Entry 2                         Entry 3                                          Case Attendee             BAIRD-MD,  REBECCA G            OTTINGER-MD,  W STAN            FLINNER-CRNA,  DANA                                                                                              WEEKS    Role Performed            Surgeon Primary                 Surgeon Secondary  CRNA    Time In                   12/16/15 08:04:00               12/16/15 08:04:00               12/16/15 08:04:00    Time Out                  12/16/15 10:08:00               12/16/15 10:08:00               12/16/15 10:08:00    Procedure                 Hysterectomy Vaginal            Hysterectomy Vaginal            Hysterectomy Vaginal                              Laparoscopic Assist             Laparoscopic Assist             Laparoscopic Assist    Last Modified By:         Fairy RN, Delon JONELLE Fairy, RN, Delon JONELLE Fairy, RNDelon JONELLE                              12/16/15 10:17:09               12/16/15 10:17:09               12/16/15 10:17:09                                Entry 4                         Entry 5                         Entry 6                                           Case Attendee             ANTON,  JEFFREY           TISDALE,  DANA K                Desmond, RN, Delon JONELLE HUTCHINSON    Role Performed            Anesthesiologist                Surgical Scrub                  Circulator    Time In  12/16/15 08:04:00               12/16/15 08:04:00               12/16/15 08:04:00    Time Out                  12/16/15 10:08:00               12/16/15 10:08:00               12/16/15 10:08:00    Procedure                 Hysterectomy Vaginal            Hysterectomy Vaginal            Hysterectomy Vaginal                              Laparoscopic Assist             Laparoscopic Assist             Laparoscopic Assist    Last Modified By:         Fairy RN, Delon JONELLE Fairy, RN, Delon JONELLE Fairy, RNDelon JONELLE                              12/16/15 10:17:09               12/16/15 10:17:09               12/16/15 10:17:09      SFOR - Case Attendance Audit                                                                     12/16/15 10:17:09         Owner: ALI                               Modifier: DESMJE                                                        <+> 1         Time Out        <+> 1         Procedure            2     <+> Time In            2     <+> Time Out            2     <*> Procedure                              Hysterectomy Vaginal Laparoscopic Assist  3     <+> Time In            3     <+> Time Out            3     <*> Procedure                              Hysterectomy Vaginal Laparoscopic Assist            4     <+> Time In            4     <+> Time Out            4     <*> Procedure                              Hysterectomy Vaginal Laparoscopic Assist            5     <+> Time In            5     <+> Time Out            5     <*> Procedure                              Hysterectomy Vaginal Laparoscopic Assist            6     <+> Time In            6      <+> Time Out            6     <*> Procedure                              Hysterectomy Vaginal Laparoscopic Assist     12/16/15 08:41:21         Owner: DESMJE                               Modifier: DESMJE                                                        <+> 1         Time In        <+> 2         Case Attendee        <+> 2         Role Performed        <+> 2         Procedure        <+> 3         Case Attendee        <+> 3         Role Performed        <+> 3         Procedure        <+> 4         Case Attendee        <+>  4         Role Performed        <+> 4         Procedure        <+> 5         Case Attendee        <+> 5         Role Performed        <+> 5         Procedure        <+> 6         Case Attendee        <+> 6         Role Performed        <+> 6         Procedure        SFOR - Skin Assessment                                                                          Pre-Care Text:            A.240 Assesses baseline skin condition Im.120 Implements protective measures to prevent skin or tissue injury           due to mechanical sources  Im.280.1 Implements progective measures to prevent skin or tissue injury due to           thermal sources Im.360 Monitors for signs and symptons of infection                              Entry 1                                                                                                          Skin Integrity            Intact    Last Modified By:         Fairy, RN, Delon SAUNDERS                              12/16/15 08:41:26    Post-Care Text:            E.10 Evaluates for signs and symptoms of physical injury to skin and tissue E.270 Evaluate tissue perfusion           O.60 Patient is free from signs and symptoms of injury caused by extraneous objects   O.210 Patinet's tissue           perfusion is consistent with or improved from baseline levels      [facility] - Patient Positioning  Pre-Care Text:             A.240 Assesses baseline skin condition A.280 Identifies baseline musculoskeletal status A.280.1 Identifies           physical alterations that require additional precautions for procedure-specific positioning A.510.8 Maintains           patient's dignity and privacy Im.120 Implements protective measures to prevent skin/tissue injury due to           mechanical sources Im.40 Positions the patient Im.80 Applies safety devices                              Entry 1                                                                                                          Procedure                 Hysterectomy Vaginal            Body Position                   Lithotomy                              Laparoscopic Assist    Left Arm Position         Tucked and Padded at            Right Arm Position              Tucked and Padded at                              Side                                                            Side    Left Leg Position         Stirrup Leg Support             Right Leg Position              Stirrup Leg Support                              Lift Assist Secured In                                          Therapist, art Secured In    Feet Uncrossed            Yes  Pressure Points                 Yes                                                              Checked     Additional                [place] non slip           Positioning Device              Arm Cradle Foam/Gel,    Information               pad on table                                                    Pillow, Other/See                                                                                              Comments    Positioned By             Fairy, RN, Jennifer           Outcome Met (O.80)              Yes                              R, FLINNER-CRNA,  DANA                              WEEKS, BAIRD-MD,                              REBECCA G    Last Modified By:         Fairy, RN, Delon SAUNDERS                               12/16/15 08:41:51    Post-Care Text:            E.10 Evaluates for signs and symptoms of physical injury to skin and tissue E.290 Evaluates musculoskeletal           status O.80 Patient is free from signs and symptoms of injury related to positioning O.120 the patient is free           from signs and symptoms of injury related to transfer/transport  O.250 Patient's musculoskeletal status is           maintained at or improved from baseline levels  S[facility] - Skin Prep                                                                                Pre-Care Text:            A.30 Verifies allergies A.20 Verifies procedure, surgical site, and laterality A.510.8 Maintains paritnet's           dignity and privacy Im.270 Performs Skin Preparation Im.270.1 Implements protective measures to prevent skin           and tissue injury due to chemical sources  A.300.1 Protects from cross-contamination                              Entry 1                         Entry 2                                                                          Hair Removal      Hair Removal By      Hair Removal Methods      Hair Removal Site      Hair Removal Site      Details     Skin Prep      Prep Agents (Im.270)     Chlorhexidine Gluconate         Chlorhexidine Gluconate                              2% w/Alcohol                    4%     Prep Area (Im.270)       Abdomen                         Labia/Perineum/Urethra,                                                              Vagina     Prep Area Details      Prep By                  Fairy, RN, Delon JONELLE SILOS,  DANA K    Outcome Met (O.100)       Yes                             Yes    Last  Modified By:         Fairy, RN, Delon JONELLE Fairy, RNDelon JONELLE                              12/16/15 08:43:02               12/16/15 08:43:02    Post-Care Text:            E.10 Evaluates for signs and symptoms of physical injury to skin and tissue O.100  Patient is free from signs           and symptoms of chemical injury  O.740 The patient's right to privacy is maintained      SFOR - Counts Initial and Final                                                                 Pre-Care Text:            A.20 Verifies operative procedure, sugical site, and laterality A.20.2 Assesses the risk for unintended           retained foreign body Im.20 Performs required counts                              Entry 1                                                                                                          Initial Counts      Initial Counts           Fairy, RN, Delon           Items included in               Instruments, Sponges,     Performed By             JONELLE SILOS,  DANA K             the Initial Count               Sharps    Final Counts      Final Counts             Fairy, RN, Delon           Final Count Status              Correct     Performed By             JONELLE SILOS,  DANA K     Items Included in        Instruments, Sponges,     Final Count  Sharps    Outcome Met (O.20)        Yes    Last Modified By:         Fairy, RN, Delon SAUNDERS                              12/16/15 08:43:31    Post-Care Text:            E.50 Evaluates results of the surgical count O.20 Patient is free from unintended retained foreign objects      SFOR - General Case Data                                                                        Pre-Care Text:            A.350.1 Classifies surgical wound                              Entry 1                                                                                                          Case Information      ASA Class                2                               Case Level                      Level 3     OR                       SF 05                           Specialty                       Gynecology and                                                                                              Obstetrics  (SN)     Wound Class  2-Clean-Contaminated    Preop Diagnosis           FIBROID UTERUS, HX                              BREAST CA    Last Modified By:         Fairy RNDelon SAUNDERS                              12/16/15 08:47:50    Post-Care Text:            O.760 Patient receives consistent and comparable care regardless of the setting      SFOR - Fire Risk Assessment                                                                                               Entry 1                                                                                                          Fire Risk                 Alcohol Based Prep              Fire Risk Score                 2    Assessment: If            Solution, Ignition    checked, checkmark        Source In Use    = 1 point     Last Modified By:         Fairy, RNDelon SAUNDERS                              12/16/15 08:47:12      SFOR - Safety Checklist - Time Out                                                              Pre-Care Text:            A.10 Confirms patient identity A.20 Verifies operative procedure, surgical site, and laterality A.20.1 Verifies           consent for planned procedure A.30 Verifies allergies  Entry 1                                                                                                          Surgical/Procedure        Yes                             Time Out Complete               12/16/15 08:19:00    Team confirms     correct patient,     correct site and     correct procedure     Last Modified By:         Fairy RN, Delon SAUNDERS                              12/16/15 08:46:08    Post-Care Text:            E.30 Evaluates verification process for correct patient, site, side, and level surgery      SFOR - Cautery                                                                                  Pre-Care Text:            A.240 Assesses baseline skin condition A280.1 Identifies baseline musculoskeletal  status Im.50 Implements           protective measures to prevent injury due to electrical sources  Im.60 Uses supplies and equipment within safe           parameters Im.80 Applies safety devices                              Entry 1                                                                                                          ESU Type                  GENERATOR  Identification                  [id number]                              COVIDIEN/[employer]LEY[employer]              Number     Coag Setting (watts)      35                              Cut Setting (watts)             35    Grounding Pad             Yes                             Grounding Pastor Spring, RN, Delon R    Needed?                                                   Applied By     Outcome Met (O.10)        Yes    Last Modified By:         Spring, RN, Delon SAUNDERS                              12/16/15 08:47:37    Post-Care Text:            E.10 Evaluates for signs and symptoms of physical injury to skin and tissue O.10 Patient is free from signs and           symptoms of injury related to thermal sources  O.70 Patient is free from signs and symptoms of electrical injury      SFOR - Patient Care Devices                                                                     Pre-Care Text:            A.200 Assesses risk for normothermia regulation A.40 Verifies presence of prosthetics or corrective devices           Im.280 Implements thermoregulation measures Im.60 Uses supplies and equipment within safe parameters                              Entry 1                         Entry 2  Equipment Type            MACHINE SEQUENTIAL              BAIR HUGGER                              COMPRESSION    SCD Sleeve Site           Legs Bilateral    Equipment/Tag Number      [id number]                           [id number]    Initiated Pre             Yes    Induction      Last Modified By:         Fairy, RN, Delon JONELLE Fairy, RNDelon JONELLE                              12/16/15 08:46:34               12/16/15 08:46:34    Post-Care Text:            E.10 Evaluates signs and symptoms of physical injury to skin and tissue O.60 Patient is free from signs and           symptoms of injury caused by extraneous objects      SFOR - Medications                                                                              Pre-Care Text:            A.10 Confirms patient identity A.30 Verifies allergies Im.220 Administers prescribed medications Im.220.2           Administers prescribed antibiotic therapy as ordered                              Entry 1                                                                                                          Medication                BUPIVACAINE HCL 0.5%            Route of Admin                  Local Injection  INJECTION 0.5%    Dose/Volume                                          Site                            Abdomen    (include amount and     unit of measure)     Administered By           BAIRD-MD,  REBECCA G            Outcome Met (O.130)             Yes    Last Modified By:         Fairy, RN, Delon SAUNDERS                              12/16/15 08:46:54    Post-Care Text:            E.20 Evaluates response to medications O.130 Patient receives appropriately administerd medication(s)      SFOR - Specimens                                                                                                          Entry 1                                                                                                          Description               UTERUS & CERVIX,                Specimen Type                   Routine                              BILATERAL TUBES AND                              OVARIES    Last Modified By:         Fairy OBIE Delon SAUNDERS                              12/16/15  08:45:52  [facility] - Implants/Endoscopy Stents                                                                Pre-Care Text:            A.20 Verifies operative procedure, surgical site, and laterality A.20.1 Verifies consent for planned procedure           Im.350 Records implants inserted during the operative or invasive procedure                              Entry 1                                                                                                          Catalog #                [id number]    Implant     Identification      Description              HEMOSTATIC FIBRIN               Expiration Date                 02/23/17                              SEALANT TISSEEL FROZEN                              10.0ML KIT (MUST ORDER                              6EA)     Lot Number               [id number]                        Manufacturer                    [employer]    Usage Data      Implant Site             ABDOMEN                         Quantity                        1    Last Modified By:         Fairy, RN, Delon SAUNDERS  12/16/15 10:17:03    Post-Care Text:            E.30 Evaluates verification process for correct patient, site, side and level surgery O.30 Patient's procedure           is performed on the correct site, side, and level      SFOR - Tubes, Drains, Catheters                                                                 Pre-Care Text:            A.310 Identifies factors associated with an increased risk for hemorrhage or fluid and electrolyte imbalance           Im.250 Administers care to invasive device sites                              Entry 1                                                                                                          Device Description        CATH URETHRAL 16FR RED          Location                        Bladder                              RUBBER LATEX    Last Modified By:         Fairy, RN, Delon SAUNDERS                               12/16/15 08:45:30    Post-Care Text:            E.340 Evaluates tubes and drains are intact and functioning as planned O.60 Patient is free from signs and           symptoms of injury caused by extraneous objects      SFOR - Dressing/Packing                                                                         Pre-Care Text:            A.350 Assesses susceptibility for infection Im.250 Administers care to invasive devices Im.290 Administer care  to wound sites  Im.300 Implements aseptic technique                              Entry 1                         Entry 2                                                                          Site                      Abdomen                         Perineum    Site Details     Dressing Item     Details      Packing (Im.290)      Dressing Item            Band-Aid                        Peripad     (Im.290)      Miscellaneous      (Im.290)      Cast/Splint (Im.290)     Last Modified By:         Fairy, RN, Delon JONELLE Fairy, RNDelon JONELLE                              12/16/15 08:44:51               12/16/15 08:44:51    Post-Care Text:            E.320 Evaluate factors associted with increased risk for postoperative infection at the completion of the           procedure O.200 Patient's wound perfusion is consistent with or improved from baseline levels  O.Patient is           free from signs and symptoms of infection      SFOR - Procedures                                                                               Pre-Care Text:            A.20 Verifies operative procedure, surgical site, and laterality Im.150 Develops individualized plan of care                              Entry 1  Procedure     Description      Procedure                Hysterectomy Vaginal            Surgical Procedure              LAVH, BSO                               Laparoscopic Assisted           Text                               SCIP    Primary Procedure         Yes                             Primary Surgeon                 ELVIS ASBERRY MATSU    Start                     12/16/15 08:19:00               Stop                            12/16/15 10:05:00    Anesthesia Type           General                         Surgical Service                Gynecology and                                                                                              Obstetrics (SN)    Wound Class               2-Clean-Contaminated    Last Modified By:         Fairy OBIE Delon JONELLE                              12/16/15 10:17:09    Post-Care Text:            O.730 The patient's care is consistent with the individualized perioperative plan of care      [facility] - Safety Checklist - Sign Out                                                              Pre-Care Text:  Im.330 Manages specimen handling and disposition                              Entry 1                                                                                                          Patient Safety            Yes    Communication Guide     Used Throughout Case     Last Modified By:         Fairy, RN, Delon SAUNDERS                              12/16/15 08:45:56    Post-Care Text:            E.800 Ensures continuity of care E.50 Evaluates results of the surgical count O.30 Patient's procedure is           performed on the correct site, side, and level O.50 patient's current status is communicated throughout the           continuum of care O.40 Patient's specimen(s) is managed in the appropriate manner      SFOR - Transfer                                                                                                           Entry 1                                                                                                          Transferred By            Fairy OBIE Delon           Via                              Stretcher  R, FLINNER-CRNA,  DANA                              WEEKS    Post-op Destination       PACU    Skin Assessment      Condition                Intact                          Description                     WITH SURGICAL DRESSING    Last Modified By:         Fairy OBIE Delon JONELLE                              12/16/15 08:45:10      Case Comments                                                                                         <None>              Finalized By: Fairy RN, Delon JONELLE      Document Signatures                                                                             Signed By:           Fairy RN, Delon JONELLE 12/16/15 10:17

## 2015-12-16 NOTE — Nursing Note (Signed)
Medication Administration Follow Up-Text       Medication Administration Follow Up Entered On:  12/16/2015 10:50 EDT    Performed On:  12/16/2015 11:04 EDT by Milagros EvenerHUNNICUTT, RN, Foy GuadalajaraSUZANNE R      Intervention Information:     hydromorphone  Performed by Milagros EvenerHUNNICUTT, RN, SUZANNE R on 12/16/2015 10:49:00 EDT       hydromorphone,0.5mg   IV Push,Forearm, Mid Right,other (see comment)       Medication Effectiveness Evaluation   Medication Administration Reason :   Pain   Medication Response :   Continue to observe for symptoms   Milagros EvenerHUNNICUTT, RFoy Guadalajara, SUZANNE R - 12/16/2015 10:49 EDT

## 2015-12-16 NOTE — Op Note (Signed)
Operative Report    [facility]  Kaytie Ratcliffe G. Juergen Hardenbrook, MD  Service Date: 12/16/2015    PREOPERATIVE DIAGNOSES:  Fibroid uterus with dysfunctional uterine  bleeding, history for breast cancer.    POSTOPERATIVE DIAGNOSES:  Fibroid uterus with dysfunctional uterine  bleeding, history for breast cancer.    PROCEDURE PERFORMED:  Laparoscopic-assisted vaginal hysterectomy with  bilateral salpingo-oophorectomy.    ANESTHESIA:  General endotracheal.    SURGEON:  Dr. Francoise SchaumannBaird.    ASSISTANT:  Dr. Rosary Livelyttinger.    FINDINGS:  At the time of surgery, the patient was noted to have an  enlarged fibroid uterus with 1 single large fibroid noted.  She had  normal-appearing fallopian tubes and ovaries bilaterally with  otherwise normal pelvic findings.  She did have some bowel adhesions  involving the anterior aspect of the abdominal wall with filmy  adhesions which were lysed during the procedure.  She had otherwise  normal pelvic findings.    SURGICAL TECHNIQUE:  The patient was taken to the operating room,  placed on the table in the dorsal supine position.  After induction of  general endotracheal anesthesia, she was placed in the dorsal  lithotomy position and prepped and draped in the usual manner for  laparoscopic procedure.  A weighted speculum was placed in her vagina  and a single-tooth tenaculum was used to grasp the anterior and  posterior lips of the cervix.  The uterine sound was then placed and  secured in the usual manner.  Her bladder was drained of urine with a  sterile red rubber catheter.  Attention was then turned to the abdomen  where an umbilical skin incision was made with a scalpel blade.  The  Veress needle was inserted into the peritoneal cavity, which was  confirmed with sterile saline.  Using CO2, the abdominal cavity was  then insufflated to approximately 15 mmHg.  The Veress needle was  removed and a 5 mm trocar was placed in the umbilical incision and the  peritoneal cavity was entered under direct visualization.   Second and  third puncture sites were then placed in the right and left lower  quadrants under direct visualization without complication.  After this  was accomplished, the findings as stated above were noted.  The  patient did have some adhesions involving the bowel which were filmy.   These were carefully cauterized with the LigaSure and excised in the  usual manner.  After this was accomplished, the uterus was well  visualized with findings as stated above noted.  The infundibulopelvic  ligaments were carefully cauterized bilaterally using the LigaSure and  incised.  This was then carried down to the broad ligament which was  carefully cauterized with the LigaSure and incised.  This was  continued through the cardinal ligaments bilaterally which were  carefully cauterized and incised bilaterally.  The bladder flap was  established anteriorly with sharp and blunt dissection using the  LigaSure for cautery.  The dissection was continued through the  cardinal ligaments bilaterally using the LigaSure with good hemostasis  noted.  After adequate dissection had been accomplished, the vaginal  portion of the procedure was then performed.  Attention was then  turned to the vagina where a cul-de-sac was entered sharply using  curved Mayo scissors.  A stay suture was then placed.  Curve Heaney  clamps were placed across the uterosacral ligaments bilaterally which  were clamped, incised and suture ligated with 0 Vicryl suture.  The  anterior cervical vaginal mucosa was  sharply incised with the scalpel  blade and the bladder was dissected off the cervix.  The anterior  cul-de-sac was then entered and a bladder blade was placed for  retraction.  The remaining portion of the cardinal ligaments were  clamped, incised and suture ligated with 0 Vicryl suture and the  uterus, fallopian tubes and ovaries were passed off the field.  At  this point, the posterior vaginal cuff was closed in a simple reefing  fashion with 0 Vicryl  suture.  The vaginal cuff was then closed  starting in the lateral corners with a simple running stitch of 0  Vicryl suture.  After this was accomplished, attention was then turned  to abdomen where the pelvic contents were carefully inspected.  There  was some bleeding noted along the cuff which was carefully cauterized  using the LigaSure.  There was also some small amount of oozing noted  under the bladder flap which was cauterized as well, and after noting  these findings thrombin gel was placed in this area for hemostasis.   The vaginal contents were copiously irrigated and inspected with  excellent hemostasis noted.  After noting these findings, the 5-mm  trocars were removed under direct visualization with no bleeding  noted.  CO2 gas was allowed to be expelled from the abdomen.  There is  no bleeding noted during this time and the 5 mm umbilical trocar was  then removed in the usual manner.  The incisions were then closed in a  simple interrupted fashion with 3-0 Monocryl suture.  The vagina was  again inspected with excellent hemostasis noted.  A Foley catheter was  placed at the end of the procedure to monitor urine output.  At the  end of the procedure, all sponge and instrument counts were correct.   There were no complications.    ESTIMATED BLOOD LOSS:  300 mL.    SPECIMENS REMOVED:  Include uterus, cervix, fallopian tubes, and  ovaries.      Reece Packer, MD  TR: *n DD: 12/16/2015 10:55 TD: 12/16/2015 11:09 Job#: [id number]  \\[id number]#: [id number]  \\[id number]  Signature Line    Electronically Signed on 12/17/2015 08:06 AM EDT  ________________________________________________  Lissa Morales

## 2015-12-17 NOTE — Discharge Summary (Signed)
 [facility] Clinical Summary             [facility]  Post-Acute Care Transfer Instructions  PERSON INFORMATION   Name: Brandi Fritz, Brandi Fritz   MRN: [id number]    FIN#: [phone]   PHYSICIANS  Admitting Physician: ELVIS ASBERRY MATSU  Attending Physician: ELVIS ASBERRY MATSU   PCP: PAMULA BRASIL D  Discharge Diagnosis:    Comment:       PATIENT EDUCATION INFORMATION  Instructions:             < %DISINSTRUCT%>  Medication Leaflets:             < %MEDLEAFLETNAMES%>  Follow-up:             < %FOLLOWUPTABLE%>             < %SCHEDULE%>     MEDICATION LIST  Medication Reconciliation at Discharge:         Medications that have not changed  Other Medications  acyclovir (acyclovir 800 mg oral tablet) 1 Tabs Oral (given by mouth) every 12 hours as needed.  Last Dose:____________________  clonazePAM (KlonoPIN 0.5 mg oral tablet) 1 Tabs Oral (given by mouth) 2 times a day as needed., SOUND ALIKE / LOOK ALIKE - VERIFY DRUG   SOUND ALIKE / LOOK ALIKE - VERIFY DRUG  Last Dose:____________________  cyanocobalamin (Vitamin B12) 1 Tabs Oral (given by mouth) every day.  Last Dose:____________________  cyclobenzaprine (Flexeril 10 mg oral tablet) 1 Tabs Oral (given by mouth) 3 times a day as needed.,  THIS MEDICATION IS ASSOCIATED WITH AN INCREASED RISK OF FALLS.  Last Dose:____________________  ergocalciferol (Vitamin D) 400 Milligram Oral (given by mouth) every day.  Last Dose:____________________  gabapentin (gabapentin 300 mg oral capsule) 2 Capsules Oral (given by mouth) Once a Day (at bedtime).  Last Dose:____________________  levothyroxine 0.088 Milligram Oral (given by mouth) once a day (in the morning).  Last Dose:____________________  lutein (Lutein) 1 Capsules Oral (given by mouth) Monday/Wednesday/Friday.  Taking For and suppl Fibromalic  Last Dose:____________________  Misc Rx Supply (Fibromyalgia Relief() 3 Tabs Oral (given by mouth) 3 times a day. pt said it has magnesium,malic acid,olive leaf  extract,OTC.  Last Dose:____________________  Misc Rx Supply (iron with vit.B12) 1 Tabs Oral (given by mouth) every day.  Last Dose:____________________  multivitamin with minerals (Centrum) 1 Tabs Oral (given by mouth) Monday/Wednesday/Friday.  Last Dose:____________________  nitrofurantoin (nitrofurantoin macrocrystals 100 mg oral capsule) 1 Capsules Oral (given by mouth) 4 times a day for 10 Days.  Last Dose:____________________  traMADol (traMADol 50 mg oral tablet) 1 Tabs Oral (given by mouth) every 12 hours as needed.  Last Dose:____________________  No Longer Take the Following Medications  megestrol (megestrol 20 mg oral tablet) 10 Milligram Oral (given by mouth) 2 times a day.  Stop Taking Reason: Physician Request         Patient's Final Home Medication List Upon Discharge:          acyclovir (acyclovir 800 mg oral tablet) 1 Tabs Oral (given by mouth) every 12 hours as needed.  clonazePAM (KlonoPIN 0.5 mg oral tablet) 1 Tabs Oral (given by mouth) 2 times a day as needed., SOUND ALIKE / LOOK ALIKE - VERIFY DRUG   SOUND ALIKE / LOOK ALIKE - VERIFY DRUG  cyanocobalamin (Vitamin B12) 1 Tabs Oral (given by mouth) every day.  cyclobenzaprine (Flexeril 10 mg oral tablet) 1 Tabs Oral (given by mouth) 3 times a day as needed.,  THIS  MEDICATION IS ASSOCIATED WITH AN INCREASED RISK OF FALLS.  ergocalciferol (Vitamin D) 400 Milligram Oral (given by mouth) every day.  gabapentin (gabapentin 300 mg oral capsule) 2 Capsules Oral (given by mouth) Once a Day (at bedtime).  levothyroxine 0.088 Milligram Oral (given by mouth) once a day (in the morning).  lutein (Lutein) 1 Capsules Oral (given by mouth) Monday/Wednesday/Friday.  Taking For and suppl Fibromalic  Misc Rx Supply (Fibromyalgia Relief() 3 Tabs Oral (given by mouth) 3 times a day. pt said it has magnesium,malic acid,olive leaf extract,OTC.  Misc Rx Supply (iron with vit.B12) 1 Tabs Oral (given by mouth) every day.  multivitamin with minerals (Centrum) 1 Tabs  Oral (given by mouth) Monday/Wednesday/Friday.  nitrofurantoin (nitrofurantoin macrocrystals 100 mg oral capsule) 1 Capsules Oral (given by mouth) 4 times a day for 10 Days.  traMADol (traMADol 50 mg oral tablet) 1 Tabs Oral (given by mouth) every 12 hours as needed.         Comment:       ORDERS         Order Name Order Details   Discharge Patient 12/17/15 10:42:00 EDT

## 2015-12-17 NOTE — Discharge Summary (Signed)
[facility] Patient Summary               [facility]  [address]  [place], [place] [address]  [phone]  Patient Discharge Instructions     Name: Brandi Fritz, Brandi Fritz  Current Date: 12/17/2015 10:49:56  DOB: 29-Mar-1956 MRN: [id number] FIN: [id number]>[phone]  Patient Address: [address] [place] [address]  Patient Phone: [phone]  Primary Care Provider:  Name: Katrina Stack  Phone: [phone]   Immunizations Provided:       Discharge Diagnosis:   Discharged To:   Home Treatments: TREATMENTS%>  Devices/Equipment:   Post Hospital Services: SERVICES%>  Professional Skilled Services: SKILLED SERVICES%>  Therapist, sports and Community Resources: SERVICES AND COMMUNITY RESOURCES%>  Mode of Discharge Transportation:   Discharge Orders:         Discharge Patient 12/17/15 10:42:00 EDT         Comment:      Medications   During the course of your visit, your medication list was updated with the most current information. The details of those changes are reflected below:         Medications that have not changed  Other Medications  acyclovir (acyclovir 800 mg oral tablet) 1 Tabs Oral (given by mouth) every 12 hours as needed.  Last Dose:____________________  clonazePAM (KlonoPIN 0.5 mg oral tablet) 1 Tabs Oral (given by mouth) 2 times a day as needed., SOUND ALIKE / LOOK ALIKE - VERIFY DRUG   SOUND ALIKE / LOOK ALIKE - VERIFY DRUG  Last Dose:____________________  cyanocobalamin (Vitamin B12) 1 Tabs Oral (given by mouth) every day.  Last Dose:____________________  cyclobenzaprine (Flexeril 10 mg oral tablet) 1 Tabs Oral (given by mouth) 3 times a day as needed., " THIS MEDICATION IS ASSOCIATED WITH AN INCREASED RISK OF FALLS."  Last Dose:____________________  ergocalciferol (Vitamin D) 400 Milligram Oral (given by mouth) every day.  Last Dose:____________________  gabapentin (gabapentin 300 mg oral capsule) 2 Capsules Oral (given by mouth) Once a Day (at bedtime).  Last  Dose:____________________  levothyroxine 0.088 Milligram Oral (given by mouth) once a day (in the morning).  Last Dose:____________________  lutein (Lutein) 1 Capsules Oral (given by mouth) Monday/Wednesday/Friday.  Taking For and suppl Fibromalic  Last Dose:____________________  Misc Rx Supply (Fibromyalgia Relief() 3 Tabs Oral (given by mouth) 3 times a day. pt said it has magnesium,malic acid,olive leaf extract,OTC.  Last Dose:____________________  Misc Rx Supply (iron with vit.B12) 1 Tabs Oral (given by mouth) every day.  Last Dose:____________________  multivitamin with minerals (Centrum) 1 Tabs Oral (given by mouth) Monday/Wednesday/Friday.  Last Dose:____________________  nitrofurantoin (nitrofurantoin macrocrystals 100 mg oral capsule) 1 Capsules Oral (given by mouth) 4 times a day for 10 Days.  Last Dose:____________________  traMADol (traMADol 50 mg oral tablet) 1 Tabs Oral (given by mouth) every 12 hours as needed.  Last Dose:____________________  No Longer Take the Following Medications  megestrol (megestrol 20 mg oral tablet) 10 Milligram Oral (given by mouth) 2 times a day.  Stop Taking Reason: Physician Request         [facility] would like to thank you for allowing [place] to assist you with your healthcare needs. The following includes patient education materials and information regarding your injury/illness.     Brandi Fritz has been given the following list of follow-up instructions, prescriptions, and patient education materials:  Follow-up Instructions             With: Address: When:  BAIRD-MD, [place]BECCA G     [phone] In 2 weeks                       It is important to always keep an active list of medications available so that you can share with other providers and manage your medications appropriately. As an additional courtesy, we are also providing you with your final active medications list that you can keep with you.           acyclovir (acyclovir 800 mg oral tablet) 1 Tabs  Oral (given by mouth) every 12 hours as needed.  clonazePAM (KlonoPIN 0.5 mg oral tablet) 1 Tabs Oral (given by mouth) 2 times a day as needed., SOUND ALIKE / LOOK ALIKE - VERIFY DRUG   SOUND ALIKE / LOOK ALIKE - VERIFY DRUG  cyanocobalamin (Vitamin B12) 1 Tabs Oral (given by mouth) every day.  cyclobenzaprine (Flexeril 10 mg oral tablet) 1 Tabs Oral (given by mouth) 3 times a day as needed., " THIS MEDICATION IS ASSOCIATED WITH AN INCREASED RISK OF FALLS."  ergocalciferol (Vitamin D) 400 Milligram Oral (given by mouth) every day.  gabapentin (gabapentin 300 mg oral capsule) 2 Capsules Oral (given by mouth) Once a Day (at bedtime).  levothyroxine 0.088 Milligram Oral (given by mouth) once a day (in the morning).  lutein (Lutein) 1 Capsules Oral (given by mouth) Monday/Wednesday/Friday.  Taking For and suppl Fibromalic  Misc Rx Supply (Fibromyalgia Relief() 3 Tabs Oral (given by mouth) 3 times a day. pt said it has magnesium,malic acid,olive leaf extract,OTC.  Misc Rx Supply (iron with vit.B12) 1 Tabs Oral (given by mouth) every day.  multivitamin with minerals (Centrum) 1 Tabs Oral (given by mouth) Monday/Wednesday/Friday.  nitrofurantoin (nitrofurantoin macrocrystals 100 mg oral capsule) 1 Capsules Oral (given by mouth) 4 times a day for 10 Days.  traMADol (traMADol 50 mg oral tablet) 1 Tabs Oral (given by mouth) every 12 hours as needed.      Take only the medications listed above. Contact your doctor prior to taking any medications not on this list.        Discharge instructions, if any, will display below     Instructions for Diet: for Diet%>   Instructions for Supplements: Instructions%>   Instructions for Activity: for Activity%>   Instructions for Wound Care: for Wound Care%>     Medication leaflets, if any, will display below         Patient education materials, if any, will display below              IS IT A STROKE?  Act FAST and Check for these signs:     FACE                  Does the face look uneven?      ARM                    Does one arm drift down?     SPEECH             Does their speech sound strange?     TIME                   Call 9-1-1 at any sign of stroke  Heart Attack Signs  Chest discomfort: Most heart attacks involve discomfort in the center of the chest and lasts more than a few minutes, or goes away and comes back. It can feel  like uncomfortable pressure, squeezing, fullness or pain.  Discomfort in upper body: Symptoms can include pain or discomfort in one or both arms, back, neck, jaw or stomach.  Shortness of breath: With or without discomfort.  Other signs: Breaking out in a cold sweat, nausea, or lightheaded.  Remember, MINUTES DO MATTER. If you experience any of these heart attack warning signs, call 9-1-1 to get immediate medical attention!             Yes - Patient/Family/Caregiver demonstrates understanding of instructions given  ______________________________ ___________ ___________________ ___________  Patient/Family/ Caregiver Signature Date/Time          Provider Signature Date/Time

## 2015-12-17 NOTE — Nursing Note (Signed)
Medication Administration Follow Up-Text       Medication Administration Follow Up Entered On:  12/17/2015 6:42 EDT    Performed On:  12/17/2015 6:00 EDT by Lyn HollingsheadALEXANDER, RN, HALEY E      Intervention Information:     ibuprofen  Performed by Lyn HollingsheadALEXANDER, RN, HALEY E on 12/17/2015 04:47:00 EDT       ibuprofen,800mg   Oral,mild pain (1-3)       Medication Effectiveness Evaluation   Medication Administration Reason :   Pain   Medication Effective :   Yes   Medication Response :   Symptoms improved   Lyn HollingsheadLEXANDER, RN, HALEY E - 12/17/2015 6:42 EDT

## 2020-06-09 NOTE — Progress Notes (Signed)
Outpatient OT Certification Letter-Text       OT Outpatient Certification Letter Entered On:  06/09/2020 15:36 EDT    Performed On:  06/09/2020 15:35 EDT by Leandra Kern E               Physician Certification   Date of Injury or Surgery :   06/09/2020 EDT   Ordering Physician Name :   Drue Novel   Number of Visits This Interval :   1   Dear Physician :   Thank you for your referral. Below is the patient information for the stated interval of treatment. Please review, modify (if necessary), sign, and return. Thank you.   Date of Evaluation :   06/09/2020 EDT   OT Certification Interval End :   09/07/2020 EST   MCINTYRE, OT, Dicie Beam E - 06/09/2020 15:35 EDT   Plan   OT Frequency Outpatient :   [facility]   OT Duration Outpatient :   90    OT Duration Unit Outpatient :   Days   OT Anticipated Treatments, Needs :   Compression garment fitting and training, Edema management, Education for meticulous skin care and hygiene, Education in lymphedema management, HEP, Home management, Manual therapy, MLD/CDT, Patient education, Therapeutic exercises   OT Certification Letter Complete :   Yes   MCINTYRE, OT, MEAGHAN E - 06/09/2020 15:35 EDT   Outpatient Review   OT Clinical Assessment Summary :   Pt with hx of L breast CA, reporting intermittent L breast edema.  Upon examination pt presents with slight breast asymmetry but no obvious edema.  Pt reports she received lymphatic massage last week and that it is typically well controlled following tx.  OT provided education on the lymphatic system, causes of lymphedema, and general causes for edema post tx of breast CA.  OT also educated pt that the lymphedema clinic's goal was for pt to be independent with lymphedema management and the majority of session would be focused on HEP training and pt education.  Pt stated understanding of all education.  Pt also expressed concerns about deconditioning and feeling overwhelmed with making decisions regarding future tx.  OT provided  information on the Oncology Wellness Exercise Program and encouraged pt to reach out the the [facility] to find a support group.  Pt will benefit from outpatient OT services to provided education and training on lymphedema managment to prevent disease progression and future dysfunction.      Rehab Potential Occupational Therapy :   Morrie Sheldon, OT, MEAGHAN E - 06/09/2020 15:35 EDT   Prior Functional Level Grid   ADL :   Independent   Mobility :   Independent   Instrumental ADL :   Independent   Cognitive-Communication Skills :   Independent   MCINTYRE, OT, MEAGHAN E - 06/09/2020 15:35 EDT   Long Term Goals   Outpatient OT Long Term Goals Rehab     Long Term Goal 1  Long Term Goal 2        Goal :    Independent wiht self MLD to assist with lymphedema management   independent with compression bra wear schedule           Status :    Initial   Initial             Leandra Kern E - 06/09/2020 15:35 EDT  Pollie Meyer, OT, Dicie Beam E - 06/09/2020 15:35 EDT        Short Term Goals  Other OT Goal Grid     Goal #1  Goal #2  Goal #3      Goal :    independently state s/s of infection to report to MD to prevent hospitalization   independent with skin hygiene to prevent infection   independent with lymphedema HEP to assist with lymphedema management        Status :    Initial   Initial   Initial          Leandra Kern E - 06/09/2020 15:35 EDT  Pollie Meyer, OT, Dicie Beam E - 06/09/2020 15:35 EDT  Pollie Meyer, OT, Dicie Beam E - 06/09/2020 15:35 EDT        Signature Line                                                 Electronically Signed On 06/09/20 03:35 PM                                               _________________________________________________                                               Jesusita Oka, [facility]                        Dicatation Date: 06/09/20 03:35 PM

## 2021-06-17 DIAGNOSIS — M1711 Unilateral primary osteoarthritis, right knee: Secondary | ICD-10-CM

## 2021-06-18 ENCOUNTER — Ambulatory Visit
Admit: 2021-06-18 | Discharge: 2021-06-18 | Payer: BLUE CROSS/BLUE SHIELD | Attending: Orthopaedic Surgery | Primary: Family Medicine

## 2021-06-18 DIAGNOSIS — M1711 Unilateral primary osteoarthritis, right knee: Secondary | ICD-10-CM

## 2021-06-18 MED ORDER — METHYLPREDNISOLONE ACETATE 40 MG/ML IJ SUSP
40 MG/ML | Freq: Once | INTRAMUSCULAR | Status: AC
Start: 2021-06-18 — End: 2021-06-18
  Administered 2021-06-18: 16:00:00 40 mg via INTRA_ARTICULAR

## 2021-06-28 NOTE — Progress Notes (Signed)
Assessment/Plan:    1. Primary osteoarthritis of right knee  Comments:  Moderate  to severe varus tricompartmental DJD  Orders:  -     PR ARTHROCENTESIS ASPIR&/INJ MAJOR JT/BURSA W/O [place]  -     methylPREDNISolone acetate (DEPO-MEDROL) injection 40 mg; 40 mg, Intra-artICUlar, ONCE, 1 dose, On Fri 06/18/21 at 1200    Procedure Note:  After risks and benefits of intra-articular injection were reviewed with the patient, she elected to proceed. After sterile alcohol prep, a mixture of 1cc of Depo-Medrol 40 mg/mL and 4cc of lidocaine was given through an anteriorlateral portal into the right Knee. The patient tolerated the injection well.  A band-aid was placed.  Post-injection instructions were reviewed.    Discussed tka vs continued non-op. Did well with HA in the past. Plan to request auth and steroid inj today  No follow-ups on file.     HPI  Chief Complaint   Patient presents with    Knee Pain     Right knee pain-requesting steroid injection       Brandi Fritz is a  65 y.o. Mar[place] Kitchenfemale c/o  right knee OA pain    Right Knee Exam     Muscle Strength   The patient has normal right knee strength.    Tenderness   The patient is experiencing tenderness in the lateral joint line and medial joint line.    Range of Motion   The patient has normal right knee ROM.    Other   Erythema: absent  Sensation: normal  Pulse: present  Swelling: none  Effusion: no effusion present    Comments:  Calf is supple and nontender with negative Homan's sign.             This document was created using voice recognition software so mistakes are possible. For any concerns about the wording of this document, please contact its creator for further clarification.

## 2021-07-26 NOTE — Telephone Encounter (Signed)
Patient is requesting an appointment with Dr Beulah Gandy for rt knee pain. She has been seeing Dr Glenetta Hew and has gotten a few injections. Patient states that she believes she will need replacement sx.

## 2021-07-27 NOTE — Telephone Encounter (Addendum)
Patient calling to follow up on [facility] request to Dr. Beulah Gandy for her RT knee . Would like to know if Dr. Beulah Gandy would consider seeing her to possibly discuss surgery.     Per pt Dr. Glenetta Hew is not able to perform sx for her. Say Dr. Glenetta Hew recommend Dr. Cherene Altes but she would like to set up an appt with Dr. Beulah Gandy.

## 2021-08-05 ENCOUNTER — Ambulatory Visit
Admit: 2021-08-05 | Discharge: 2021-08-05 | Payer: BLUE CROSS/BLUE SHIELD | Attending: Orthopaedic Surgery | Primary: Family Medicine

## 2021-08-05 ENCOUNTER — Ambulatory Visit: Admit: 2021-08-05 | Discharge: 2021-08-05 | Payer: BLUE CROSS/BLUE SHIELD | Primary: Family Medicine

## 2021-08-05 DIAGNOSIS — M1711 Unilateral primary osteoarthritis, right knee: Secondary | ICD-10-CM

## 2021-08-05 NOTE — Progress Notes (Signed)
Brandi Fritz   [birth year]  65 y.o.  female    What brings you in today Right knee   When did the pain start years   What made it start hurting ongoing   Have you taken any think for the pain ultram   What does your pain prevent you from doing walking , standing up from sitting. Pain is lateral            [facility]  History and Physical  Knee Pain      Name:  Brandi Fritz  DOB:  01/18/1956      Age:   65 y.o.        Reason for Visit:    Patient has failed all conservative treatment for knee arthritis and is here to discuss surgery.    HPI:   The patient is a very nice 65 year old female who has had right knee pain for quite a number of years.  In an arthroscopy at least 12 years ago for her knee and she had a debridement of her meniscus.  Since that time she has had pain off and on and it seems to be steadily worsening.  We reviewed x-rays from Dr. Manuela Schwartz office about 2 years ago that shows very significant narrowing of both the medial aspect of the left and right knee.  Right is the worst knee.  She has not really had a lot of trouble with the left knee.  She is currently on Celebrex for pain management.  For quite a few years she has had injections into the knee with corticosteroids and they typical help for a few months.  Her most recent injection did not help her at all unfortunately.  She has severe pain along the medial aspect of the knee and the injection may have lasted just a few hours.  She had Visco supplement injections per Dr. Cain Saupe about 4 5 years ago and it seemed to help her a pretty good bit.  Unfortunately her most recent experience with Visco supplements which was last May and Dr. Manuela Schwartz office gave her a "side effect and the knee pain was very great and she was unable to finish her series.  She does not want to consider Visco supplements again.  We repeated x-rays today and x-rays show severe end-stage degenerative arthritic changes with subluxation of the medial  compartment of the femur with about 6 cm of the medial femoral condyle being medial to the tibia at the bone-on-bone region.  There is spurring on the lateral compartment and she has bone-on-bone pathology also in the lateral facet of the patellofemoral joint.  Cystic changes are seen at the medial femoral condyle suggesting end-stage disease.  She has about 5 degrees of varus in the knee.  Because of the increasing mechanical symptoms that she has and the pain she now desires total knee arthroplasty after failure of conservative measures.  She would like to proceed with total knee arthroplasty.  We discussed robotic total knee arthroplasty versus custom total knee arthroplasty versus traditional total knee arthroplasty and after some discussion she would like to proceed with the [facility] robotic total knee arthroplasty.  Patient comes today with long history of arthritic changes to the right knee. Patient has failed numerous corticosteroid injections, therapy, and activity modification along with trials of nonsteroidals. Unfortunately the pain is continued and the patient is now considering total joint arthroplasty. Patient has modified activity for over 3 months. Xrays show severe end-stage degenerative arthritic changes to  the knee, with periarticular osteophytes and joint subluxation. The pain has caused significant lifestyle changes including stamina, simply getting out of a chair, and pain at rest. After a long discussion of total joint arthroplasty and explanation of the surgery and expectations post operatively the patient has opted to consider total joint arthroplasty of the right knee.    Review of Systems:  A 14 point review of systems available in the scanned medical record as documented by the patient.  The review is negative with the exception of those things mentioned in the HPI and Past Medical History.  Multiple episodes of giving way of the knee, swelling of the knee and pain around the area of her  Baker's cyst in the posterior lateral aspect of the knee.    Past History:  Past Medical History:   Diagnosis Date    Breast cancer (HCC)     Depression     Fibromyalgia     Thyroid disease      Past Surgical History:   Procedure Laterality Date    BREAST ENHANCEMENT SURGERY Bilateral     BREAST LUMPECTOMY Left     CESAREAN SECTION      x2    HYSTERECTOMY (CERVIX STATUS UNKNOWN)      KNEE SURGERY Right 03/2011    TONSILLECTOMY       Current Outpatient Medications on File Prior to Visit   Medication Sig Dispense Refill    celecoxib (CELEBREX) 200 MG capsule Take 200 mg by mouth 2 times daily      cyclobenzaprine (FLEXERIL) 10 MG tablet 1 tablet Orally as needed      Esomeprazole Magnesium (NEXIUM) 10 MG PACK 1 tab Oral      Progesterone 40 % CREA apply 2 pumps (1 gm) EVERY NIGHT AT BEDTIME as directed      fluconazole (DIFLUCAN) 200 MG tablet TAKE ONE TABLET BY MOUTH ONE TIME DAILY OR AS DIRECTED      acyclovir (ZOVIRAX) 800 MG tablet 1 capsule      amphetamine-dextroamphetamine (ADDERALL, 5MG ,) 5 MG tablet 1 tablet in the morning Orally Once a day      clonazePAM (KLONOPIN) 0.5 MG tablet 1 tablet at bedtime Orally Once a day      DULoxetine (CYMBALTA) 20 MG extended release capsule 1 capsule      gabapentin (NEURONTIN) 300 MG capsule 2 Tablets Orally once a day at bedtime      solifenacin (VESICARE) 5 MG tablet 1 tablet Orally Once a day      thyroid (ARMOUR THYROID) 90 MG tablet 1 tablet Orally Once a day for 90 days      traMADol (ULTRAM) 50 MG tablet 1 tablet Orally as needed       No current facility-administered medications on file prior to visit.     Social History     Socioeconomic History    Marital status: Married     Spouse name: Not on file    Number of children: Not on file    Years of education: Not on file    Highest education level: Not on file   Occupational History    Not on file   Tobacco Use    Smoking status: Never    Smokeless tobacco: Never   Substance and Sexual Activity    Alcohol use: Never     Drug use: Never    Sexual activity: Not on file   Other Topics Concern    Not on file   Social  History Narrative    Not on file     Social Determinants of Health     Financial Resource Strain: Not on file   Food Insecurity: Not on file   Transportation Needs: Not on file   Physical Activity: Not on file   Stress: Not on file   Social Connections: Not on file   Intimate Partner Violence: Not on file   Housing Stability: Not on file     Family History   Problem Relation Age of Onset    Stomach Cancer Father     Cervical Cancer Maternal Aunt        Current Medications:    Current Outpatient Medications   Medication Sig Dispense Refill    celecoxib (CELEBREX) 200 MG capsule Take 200 mg by mouth 2 times daily      cyclobenzaprine (FLEXERIL) 10 MG tablet 1 tablet Orally as needed      Esomeprazole Magnesium (NEXIUM) 10 MG PACK 1 tab Oral      Progesterone 40 % CREA apply 2 pumps (1 gm) EVERY NIGHT AT BEDTIME as directed      fluconazole (DIFLUCAN) 200 MG tablet TAKE ONE TABLET BY MOUTH ONE TIME DAILY OR AS DIRECTED      acyclovir (ZOVIRAX) 800 MG tablet 1 capsule      amphetamine-dextroamphetamine (ADDERALL, 5MG ,) 5 MG tablet 1 tablet in the morning Orally Once a day      clonazePAM (KLONOPIN) 0.5 MG tablet 1 tablet at bedtime Orally Once a day      DULoxetine (CYMBALTA) 20 MG extended release capsule 1 capsule      gabapentin (NEURONTIN) 300 MG capsule 2 Tablets Orally once a day at bedtime      solifenacin (VESICARE) 5 MG tablet 1 tablet Orally Once a day      thyroid (ARMOUR THYROID) 90 MG tablet 1 tablet Orally Once a day for 90 days      traMADol (ULTRAM) 50 MG tablet 1 tablet Orally as needed       No current facility-administered medications for this visit.       Allergies:  No Known Allergies    Physical Exam:  There were no vitals filed for this visit.  General: Brandi Fritz is a 65 y.o. year old female who is sitting comfortably in our office in no acute distress. Alert and oriented.    Knee: Patient walks  with antalgic gait, a slight flexion contracture and is unable to full extend the knee. Patient knee has gross varus and valgus instability, and there is significant crepitance in the knee. The patellofemoral joint is painful to deep palpation. Patient has a negative lachman, the patella is slightly ballotable, and there is pain with motion. There is no pain on internal or external rotation of the hip, there is a negative straight leg raise, the patient is neurologically intact at foot and no vascular changes are apparent on the affected side.    Neuro: alert. oriented  Eyes: Extra-ocular muscles intact  Mouth: Oral mucosa moist. No perioral lesions  Pulm: Respirations unlabored and regular.  Heart: Regular rate and rhythm   Skin: warm, well perfused    Laboratory:  Pending      Radiographic:  Right knee Xrays taken in our office today reveal no fractures, dislocations, visible tumors, or signs of acute trauma.  The remainder of the dictation of the x-ray of the knee is as above with subluxation of the knee at the medial compartment and subchondral cyst changes  throughout the knee especially in the medial femoral component and the medial tibial plateau.    Radiographic Impression: Osteoarthritis end-stage disease as described above.    Assessment:  Right tricompartmental arthritis, end-stage that has now failed conservative treatment for severely end-stage disease knee arthritis.  Patient can no longer perform activities of daily living.  She used to be able to walk her dog but she cannot walk greater than 100 yards without having to sit because of the severity of the pain in the knee.  She can no longer squat or kneel on her knee.  She has swelling in the back of the knee.  She has burning dysesthesia across the anterior aspect of the knee.  All these limit her activities of daily living and she feels like this is steadily worsened over the past couple of months.      Plan:   Plan is for right total knee  arthroplasty. We discussed post operative expectations, surgery itself and reasoning behind the surgery. We discussed PT, DVT prophylaxis, and risks of surgery including: stiffness, infection, risk of DVT, continued patellofemoral pain, numbness over lateral aspect of the knee. They understand we will prescribe home health, anticoagulant and provide only acute post operative narcotics. We offered a total joint class through the hospital. A risk and benefit discussion was held with the surgeon, Dr. Beulah Gandy who will be performing the procedure. A walker is medically necessary for this patient and was dispensed in the office.  Plan was discussed with orthopedic surgeon.    Lawana Chambers MD

## 2021-08-05 NOTE — Addendum Note (Signed)
Addended by: Tedra Coupe on: 08/05/2021 01:28 PM     Modules accepted: Orders

## 2021-08-26 ENCOUNTER — Ambulatory Visit: Payer: MEDICARE | Primary: Family Medicine

## 2021-08-26 DIAGNOSIS — M1711 Unilateral primary osteoarthritis, right knee: Secondary | ICD-10-CM

## 2021-08-30 ENCOUNTER — Encounter: Attending: Orthopaedic Surgery | Primary: Family Medicine

## 2021-08-30 NOTE — Telephone Encounter (Signed)
PATIENT WOULD LIKE A SECOND OPINION FOR RT KNEE REPLACEMENT.  PLEASE GIVE HER A CALL. PATIENT IS SCHEDULED TO SEE DR RUDOLPH THIS WEEK. [phone]

## 2021-09-01 NOTE — Telephone Encounter (Signed)
Waiting to hear back from [facility] since they are seeing the patient tomorrow.

## 2021-09-02 ENCOUNTER — Encounter: Admit: 2021-09-02 | Discharge: 2021-09-02 | Payer: MEDICARE | Attending: Orthopaedic Surgery | Primary: Family Medicine

## 2021-09-02 ENCOUNTER — Encounter

## 2021-09-02 DIAGNOSIS — M1711 Unilateral primary osteoarthritis, right knee: Secondary | ICD-10-CM

## 2021-09-02 NOTE — Progress Notes (Signed)
Brandi Fritz (DOB:  10-06-1955) is a 65 y.o. female,Established patient, here for evaluation of the following chief complaint(s):  Knee Pain (Patient reports back to [place] with right knee pain, patient reports 8-9x per day the knee gives out, taking off pants can extend knee quickly without pain, dorsiflexion with right foot hurts as well)          Subjective   SUBJECTIVE/OBJECTIVE:  HPI  Patient returns today to discuss total knee arthroplasty.  She has gotten to the point where she can no longer perform her activities of daily living secondary to severe pain.  We reviewed her x-rays from November which shows end-stage degenerative disease.  Over the past couple of years she has tried 2 different sessions of Visco supplements, amniotic fluid injections, nonsteroidal medications, corticosteroid injections that worked for a while but no longer work, therapy and strengthening.  Nothing is really giving her any pain relief any longer.  Initially she received good results from conservative measures but as the arthritis has advanced she no longer has pain relief and can no longer walk greater than a quarter of a mile without having to stop secondary to the pain in the knee.  Significant medial joint line tenderness.     Chief Complaint   Patient presents with    Knee Pain     Patient reports back to [place] with right knee pain, patient reports 8-9x per day the knee gives out, taking off pants can extend knee quickly without pain, dorsiflexion with right foot hurts as well      No Known Allergies  Current Outpatient Medications   Medication Sig Dispense Refill    celecoxib (CELEBREX) 200 MG capsule Take 200 mg by mouth 2 times daily      cyclobenzaprine (FLEXERIL) 10 MG tablet 1 tablet Orally as needed      Esomeprazole Magnesium (NEXIUM) 10 MG PACK 1 tab Oral      Progesterone 40 % CREA apply 2 pumps (1 gm) EVERY NIGHT AT BEDTIME as directed      fluconazole (DIFLUCAN) 200 MG tablet TAKE ONE TABLET BY MOUTH ONE TIME DAILY OR  AS DIRECTED      acyclovir (ZOVIRAX) 800 MG tablet 1 capsule      amphetamine-dextroamphetamine (ADDERALL, 5MG ,) 5 MG tablet 1 tablet in the morning Orally Once a day      clonazePAM (KLONOPIN) 0.5 MG tablet 1 tablet at bedtime Orally Once a day      DULoxetine (CYMBALTA) 20 MG extended release capsule 1 capsule      gabapentin (NEURONTIN) 300 MG capsule 2 Tablets Orally once a day at bedtime      solifenacin (VESICARE) 5 MG tablet 1 tablet Orally Once a day      thyroid (ARMOUR THYROID) 90 MG tablet 1 tablet Orally Once a day for 90 days      traMADol (ULTRAM) 50 MG tablet 1 tablet Orally as needed       No current facility-administered medications for this visit.     Past Medical History:   Diagnosis Date    Breast cancer (HCC)     Depression     Fibromyalgia     Thyroid disease      Past Surgical History:   Procedure Laterality Date    BREAST ENHANCEMENT SURGERY Bilateral     BREAST LUMPECTOMY Left     CESAREAN SECTION      x2    HYSTERECTOMY (CERVIX STATUS UNKNOWN)      KNEE  SURGERY Right 03/2011    TONSILLECTOMY       Family History   Problem Relation Age of Onset    Stomach Cancer Father     Cervical Cancer Maternal Aunt            [facility]  History and Physical  Knee Pain      Name:  Brandi Fritz  DOB:  Jul 11, 1956      Age:   65 y.o.        Reason for Visit:    Patient has failed all conservative treatment for knee arthritis and is here to discuss surgery.    HPI:   Patient comes today with long history of arthritic changes to the right knee. Patient has failed numerous corticosteroid injections, therapy, and activity modification along with trials of nonsteroidals. Unfortunately the pain is continued and the patient is now considering total joint arthroplasty. Patient has modified activity for over 3 months. Xrays show severe end-stage degenerative arthritic changes to the knee, with periarticular osteophytes and joint subluxation. The pain has caused significant lifestyle changes including  stamina, simply getting out of a chair, and pain at rest. After a long discussion of total joint arthroplasty and explanation of the surgery and expectations post operatively the patient has opted to consider total joint arthroplasty of the right knee.    Review of Systems:  A 14 point review of systems available in the scanned medical record as documented by the patient.  The review is negative with the exception of those things mentioned in the HPI and Past Medical History.  No fever  no hx of acute wt loss  denies constitutional  problems  no abnormal swelling in any other joints    Past History:  Past Medical History:   Diagnosis Date    Breast cancer (HCC)     Depression     Fibromyalgia     Thyroid disease      Past Surgical History:   Procedure Laterality Date    BREAST ENHANCEMENT SURGERY Bilateral     BREAST LUMPECTOMY Left     CESAREAN SECTION      x2    HYSTERECTOMY (CERVIX STATUS UNKNOWN)      KNEE SURGERY Right 03/2011    TONSILLECTOMY       Current Outpatient Medications on File Prior to Visit   Medication Sig Dispense Refill    celecoxib (CELEBREX) 200 MG capsule Take 200 mg by mouth 2 times daily      cyclobenzaprine (FLEXERIL) 10 MG tablet 1 tablet Orally as needed      Esomeprazole Magnesium (NEXIUM) 10 MG PACK 1 tab Oral      Progesterone 40 % CREA apply 2 pumps (1 gm) EVERY NIGHT AT BEDTIME as directed      fluconazole (DIFLUCAN) 200 MG tablet TAKE ONE TABLET BY MOUTH ONE TIME DAILY OR AS DIRECTED      acyclovir (ZOVIRAX) 800 MG tablet 1 capsule      amphetamine-dextroamphetamine (ADDERALL, 5MG ,) 5 MG tablet 1 tablet in the morning Orally Once a day      clonazePAM (KLONOPIN) 0.5 MG tablet 1 tablet at bedtime Orally Once a day      DULoxetine (CYMBALTA) 20 MG extended release capsule 1 capsule      gabapentin (NEURONTIN) 300 MG capsule 2 Tablets Orally once a day at bedtime      solifenacin (VESICARE) 5 MG tablet 1 tablet Orally Once a day      thyroid (  ARMOUR THYROID) 90 MG tablet 1 tablet  Orally Once a day for 90 days      traMADol (ULTRAM) 50 MG tablet 1 tablet Orally as needed       No current facility-administered medications on file prior to visit.     Social History     Socioeconomic History    Marital status: Married     Spouse name: Not on file    Number of children: Not on file    Years of education: Not on file    Highest education level: Not on file   Occupational History    Not on file   Tobacco Use    Smoking status: Never    Smokeless tobacco: Never   Substance and Sexual Activity    Alcohol use: Never    Drug use: Never    Sexual activity: Not on file   Other Topics Concern    Not on file   Social History Narrative    Not on file     Social Determinants of Health     Financial Resource Strain: Not on file   Food Insecurity: Not on file   Transportation Needs: Not on file   Physical Activity: Not on file   Stress: Not on file   Social Connections: Not on file   Intimate Partner Violence: Not on file   Housing Stability: Not on file     Family History   Problem Relation Age of Onset    Stomach Cancer Father     Cervical Cancer Maternal Aunt        Current Medications:    Current Outpatient Medications   Medication Sig Dispense Refill    celecoxib (CELEBREX) 200 MG capsule Take 200 mg by mouth 2 times daily      cyclobenzaprine (FLEXERIL) 10 MG tablet 1 tablet Orally as needed      Esomeprazole Magnesium (NEXIUM) 10 MG PACK 1 tab Oral      Progesterone 40 % CREA apply 2 pumps (1 gm) EVERY NIGHT AT BEDTIME as directed      fluconazole (DIFLUCAN) 200 MG tablet TAKE ONE TABLET BY MOUTH ONE TIME DAILY OR AS DIRECTED      acyclovir (ZOVIRAX) 800 MG tablet 1 capsule      amphetamine-dextroamphetamine (ADDERALL, ,) 5 MG tablet 1 tablet in the morning Orally Once a day      clonazePAM (KLONOPIN) 0.5 MG tablet 1 tablet at bedtime Orally Once a day      DULoxetine (CYMBALTA) 20 MG extended release capsule 1 capsule      gabapentin (NEURONTIN) 300 MG capsule 2 Tablets Orally once a day at bedtime       solifenacin (VESICARE) 5 MG tablet 1 tablet Orally Once a day      thyroid (ARMOUR THYROID) 90 MG tablet 1 tablet Orally Once a day for 90 days      traMADol (ULTRAM) 50 MG tablet 1 tablet Orally as needed       No current facility-administered medications for this visit.       Allergies:  No Known Allergies    Physical Exam:  There were no vitals filed for this visit.  General: Brandi Fritz is a 65 y.o. year old female who is sitting comfortably in our office in no acute distress. Alert and oriented.    Knee: Patient walks with antalgic gait, a slight flexion contracture and is unable to full extend the knee. Patient knee has gross varus and valgus instability,  and there is significant crepitance in the knee. The patellofemoral joint is painful to deep palpation. Patient has a negative lachman, the patella is slightly ballotable, and there is pain with motion. There is no pain on internal or external rotation of the hip, there is a negative straight leg raise, the patient is neurologically intact at foot and no vascular changes are apparent on the affected side.    Neuro: alert. oriented  Eyes: Extra-ocular muscles intact  Mouth: Oral mucosa moist. No perioral lesions  Pulm: Respirations unlabored and regular.  Heart: Regular rate and rhythm   Skin: warm, well perfused    Laboratory:  Pending      Radiographic:  Right knee Xrays taken in our office today reveal no fractures, dislocations, visible tumors, or signs of acute trauma.  There are osteoarthritic changes with bone-on-bone pathology in the medial compartment with slight subluxation of the femur on the tibia medially.  Cystic changes seen in the subchondral region of the femur suggesting severe end-stage arthritis and bone-on-bone pathology.  All 3 compartments are involved.  Tricompartmental end-stage disease.    Radiographic Impression: Osteoarthritis right knee as stated above    Assessment:  Right knee tricompartmental arthritis, end-stage that has  now failed conservative treatment for severely end-stage disease knee arthritis.       Plan:   Plan is for right total knee arthroplasty. We discussed post operative expectations, surgery itself and reasoning behind the surgery. We discussed PT, DVT prophylaxis, and risks of surgery including: stiffness, infection, risk of DVT, continued patellofemoral pain, numbness over lateral aspect of the knee. They understand we will prescribe home health, anticoagulant and provide only acute post operative narcotics. We offered a total joint class through the hospital. A risk and benefit discussion was held with the surgeon, Dr. Beulah Gandy who will be performing the procedure. A walker is medically necessary for this patient and was dispensed in the office.  Plan was discussed with orthopedic surgeon.      Joni Reining Pages, PA-C

## 2021-09-03 NOTE — H&P (Signed)
Brandi Fritz (DOB:  May 29, 1956) is a 65 y.o. female,Established patient, here for evaluation of the following chief complaint(s):  Knee Pain (Patient reports back to [place] with right knee pain, patient reports 8-9x per day the knee gives out, taking off pants can extend knee quickly without pain, dorsiflexion with right foot hurts as well)              Subjective   SUBJECTIVE/OBJECTIVE:  HPI  Patient returns today to discuss total knee arthroplasty.  She has gotten to the point where she can no longer perform her activities of daily living secondary to severe pain.  We reviewed her x-rays from November which shows end-stage degenerative disease.  Over the past couple of years she has tried 2 different sessions of Visco supplements, amniotic fluid injections, nonsteroidal medications, corticosteroid injections that worked for a while but no longer work, therapy and strengthening.  Nothing is really giving her any pain relief any longer.  Initially she received good results from conservative measures but as the arthritis has advanced she no longer has pain relief and can no longer walk greater than a quarter of a mile without having to stop secondary to the pain in the knee.  Significant medial joint line tenderness.          Chief Complaint   Patient presents with    Knee Pain       Patient reports back to [place] with right knee pain, patient reports 8-9x per day the knee gives out, taking off pants can extend knee quickly without pain, dorsiflexion with right foot hurts as well      No Known Allergies  Current Facility-Administered Medications          Current Outpatient Medications   Medication Sig Dispense Refill    celecoxib (CELEBREX) 200 MG capsule Take 200 mg by mouth 2 times daily        cyclobenzaprine (FLEXERIL) 10 MG tablet 1 tablet Orally as needed        Esomeprazole Magnesium (NEXIUM) 10 MG PACK 1 tab Oral        Progesterone 40 % CREA apply 2 pumps (1 gm) EVERY NIGHT AT BEDTIME as directed        fluconazole  (DIFLUCAN) 200 MG tablet TAKE ONE TABLET BY MOUTH ONE TIME DAILY OR AS DIRECTED        acyclovir (ZOVIRAX) 800 MG tablet 1 capsule        amphetamine-dextroamphetamine (ADDERALL, 5MG ,) 5 MG tablet 1 tablet in the morning Orally Once a day        clonazePAM (KLONOPIN) 0.5 MG tablet 1 tablet at bedtime Orally Once a day        DULoxetine (CYMBALTA) 20 MG extended release capsule 1 capsule        gabapentin (NEURONTIN) 300 MG capsule 2 Tablets Orally once a day at bedtime        solifenacin (VESICARE) 5 MG tablet 1 tablet Orally Once a day        thyroid (ARMOUR THYROID) 90 MG tablet 1 tablet Orally Once a day for 90 days        traMADol (ULTRAM) 50 MG tablet 1 tablet Orally as needed          No current facility-administered medications for this visit.         Past Medical History        Past Medical History:   Diagnosis Date    Breast cancer (HCC)  Depression      Fibromyalgia      Thyroid disease           Past Surgical History         Past Surgical History:   Procedure Laterality Date    BREAST ENHANCEMENT SURGERY Bilateral      BREAST LUMPECTOMY Left      CESAREAN SECTION         x2    HYSTERECTOMY (CERVIX STATUS UNKNOWN)        KNEE SURGERY Right 03/2011    TONSILLECTOMY             Family History         Family History   Problem Relation Age of Onset    Stomach Cancer Father      Cervical Cancer Maternal Aunt                 [facility]  History and Physical  Knee Pain        Name:  Brandi Fritz  DOB:  1956/06/05      Age:   65 y.o.          Reason for Visit:    Patient has failed all conservative treatment for knee arthritis and is here to discuss surgery.     HPI:   Patient comes today with long history of arthritic changes to the right knee. Patient has failed numerous corticosteroid injections, therapy, and activity modification along with trials of nonsteroidals. Unfortunately the pain is continued and the patient is now considering total joint arthroplasty. Patient has modified activity  for over 3 months. Xrays show severe end-stage degenerative arthritic changes to the knee, with periarticular osteophytes and joint subluxation. The pain has caused significant lifestyle changes including stamina, simply getting out of a chair, and pain at rest. After a long discussion of total joint arthroplasty and explanation of the surgery and expectations post operatively the patient has opted to consider total joint arthroplasty of the right knee.     Review of Systems:  A 14 point review of systems available in the scanned medical record as documented by the patient.  The review is negative with the exception of those things mentioned in the HPI and Past Medical History.  No fever  no hx of acute wt loss  denies constitutional  problems  no abnormal swelling in any other joints     Past History:  Past Medical History        Past Medical History:   Diagnosis Date    Breast cancer (HCC)      Depression      Fibromyalgia      Thyroid disease           Past Surgical History         Past Surgical History:   Procedure Laterality Date    BREAST ENHANCEMENT SURGERY Bilateral      BREAST LUMPECTOMY Left      CESAREAN SECTION         x2    HYSTERECTOMY (CERVIX STATUS UNKNOWN)        KNEE SURGERY Right 03/2011    TONSILLECTOMY                    Current Outpatient Medications on File Prior to Visit   Medication Sig Dispense Refill    celecoxib (CELEBREX) 200 MG capsule Take 200 mg by mouth 2 times daily  cyclobenzaprine (FLEXERIL) 10 MG tablet 1 tablet Orally as needed        Esomeprazole Magnesium (NEXIUM) 10 MG PACK 1 tab Oral        Progesterone 40 % CREA apply 2 pumps (1 gm) EVERY NIGHT AT BEDTIME as directed        fluconazole (DIFLUCAN) 200 MG tablet TAKE ONE TABLET BY MOUTH ONE TIME DAILY OR AS DIRECTED        acyclovir (ZOVIRAX) 800 MG tablet 1 capsule        amphetamine-dextroamphetamine (ADDERALL, ,) 5 MG tablet 1 tablet in the morning Orally Once a day        clonazePAM (KLONOPIN) 0.5 MG tablet 1  tablet at bedtime Orally Once a day        DULoxetine (CYMBALTA) 20 MG extended release capsule 1 capsule        gabapentin (NEURONTIN) 300 MG capsule 2 Tablets Orally once a day at bedtime        solifenacin (VESICARE) 5 MG tablet 1 tablet Orally Once a day        thyroid (ARMOUR THYROID) 90 MG tablet 1 tablet Orally Once a day for 90 days        traMADol (ULTRAM) 50 MG tablet 1 tablet Orally as needed          No current facility-administered medications on file prior to visit.      Social History               Socioeconomic History    Marital status: Married       Spouse name: Not on file    Number of children: Not on file    Years of education: Not on file    Highest education level: Not on file   Occupational History    Not on file   Tobacco Use    Smoking status: Never    Smokeless tobacco: Never   Substance and Sexual Activity    Alcohol use: Never    Drug use: Never    Sexual activity: Not on file   Other Topics Concern    Not on file   Social History Narrative    Not on file      Social Determinants of Health      Financial Resource Strain: Not on file   Food Insecurity: Not on file   Transportation Needs: Not on file   Physical Activity: Not on file   Stress: Not on file   Social Connections: Not on file   Intimate Partner Violence: Not on file   Housing Stability: Not on file         Family History         Family History   Problem Relation Age of Onset    Stomach Cancer Father      Cervical Cancer Maternal Aunt              Current Medications:    Current Facility-Administered Medications          Current Outpatient Medications   Medication Sig Dispense Refill    celecoxib (CELEBREX) 200 MG capsule Take 200 mg by mouth 2 times daily        cyclobenzaprine (FLEXERIL) 10 MG tablet 1 tablet Orally as needed        Esomeprazole Magnesium (NEXIUM) 10 MG PACK 1 tab Oral        Progesterone 40 % CREA apply 2 pumps (1 gm) EVERY NIGHT AT BEDTIME as  directed        fluconazole (DIFLUCAN) 200 MG tablet TAKE ONE  TABLET BY MOUTH ONE TIME DAILY OR AS DIRECTED        acyclovir (ZOVIRAX) 800 MG tablet 1 capsule        amphetamine-dextroamphetamine (ADDERALL, 5MG ,) 5 MG tablet 1 tablet in the morning Orally Once a day        clonazePAM (KLONOPIN) 0.5 MG tablet 1 tablet at bedtime Orally Once a day        DULoxetine (CYMBALTA) 20 MG extended release capsule 1 capsule        gabapentin (NEURONTIN) 300 MG capsule 2 Tablets Orally once a day at bedtime        solifenacin (VESICARE) 5 MG tablet 1 tablet Orally Once a day        thyroid (ARMOUR THYROID) 90 MG tablet 1 tablet Orally Once a day for 90 days        traMADol (ULTRAM) 50 MG tablet 1 tablet Orally as needed          No current facility-administered medications for this visit.            Allergies:  No Known Allergies     Physical Exam:  There were no vitals filed for this visit.  General: Nadene Witherspoon is a 66 y.o. year old female who is sitting comfortably in our office in no acute distress. Alert and oriented.     Knee: Patient walks with antalgic gait, a slight flexion contracture and is unable to full extend the knee. Patient knee has gross varus and valgus instability, and there is significant crepitance in the knee. The patellofemoral joint is painful to deep palpation. Patient has a negative lachman, the patella is slightly ballotable, and there is pain with motion. There is no pain on internal or external rotation of the hip, there is a negative straight leg raise, the patient is neurologically intact at foot and no vascular changes are apparent on the affected side.     Neuro: alert. oriented  Eyes: Extra-ocular muscles intact  Mouth: Oral mucosa moist. No perioral lesions  Pulm: Respirations unlabored and regular.  Heart: Regular rate and rhythm   Skin: warm, well perfused     Laboratory:  Pending        Radiographic:  Right knee Xrays taken in our office today reveal no fractures, dislocations, visible tumors, or signs of acute trauma.  There are osteoarthritic  changes with bone-on-bone pathology in the medial compartment with slight subluxation of the femur on the tibia medially.  Cystic changes seen in the subchondral region of the femur suggesting severe end-stage arthritis and bone-on-bone pathology.  All 3 compartments are involved.  Tricompartmental end-stage disease.    Radiographic Impression: Osteoarthritis right knee as stated above     Assessment:  Right knee tricompartmental arthritis, end-stage that has now failed conservative treatment for severely end-stage disease knee arthritis.         Plan:   Plan is for right total knee arthroplasty. We discussed post operative expectations, surgery itself and reasoning behind the surgery. We discussed PT, DVT prophylaxis, and risks of surgery including: stiffness, infection, risk of DVT, continued patellofemoral pain, numbness over lateral aspect of the knee. They understand we will prescribe home health, anticoagulant and provide only acute post operative narcotics. We offered a total joint class through the hospital. A risk and benefit discussion was held with the surgeon, Dr. 77 who will be performing the procedure. A  walker is medically necessary for this patient and was dispensed in the office.  Plan was discussed with orthopedic surgeon.

## 2021-09-03 NOTE — Telephone Encounter (Signed)
Looks like Dr.Rudolph is going to do the total knee surgery on her. Im not sure if you still need to contact her for the second opinion.

## 2021-09-16 NOTE — Telephone Encounter (Signed)
Patient has decided to cx her surgery for now but wanted to let you know that she appreciates everything that has been done for her. Thank you

## 2021-09-23 NOTE — Telephone Encounter (Signed)
Called back and cx sx

## 2021-10-01 ENCOUNTER — Inpatient Hospital Stay: Payer: MEDICARE

## 2022-03-02 ENCOUNTER — Encounter

## 2022-03-14 ENCOUNTER — Encounter

## 2022-04-05 ENCOUNTER — Inpatient Hospital Stay: Admit: 2022-04-05 | Payer: MEDICARE | Attending: Family Medicine | Primary: Family Medicine

## 2022-04-05 ENCOUNTER — Encounter

## 2022-04-05 DIAGNOSIS — Z1231 Encounter for screening mammogram for malignant neoplasm of breast: Secondary | ICD-10-CM

## 2022-04-28 ENCOUNTER — Ambulatory Visit: Admit: 2022-04-28 | Discharge: 2022-04-28 | Payer: MEDICARE | Attending: Physician Assistant | Primary: Family Medicine

## 2022-04-28 DIAGNOSIS — Z853 Personal history of malignant neoplasm of breast: Secondary | ICD-10-CM

## 2022-04-28 NOTE — Progress Notes (Signed)
04/28/22     Chief Complaint: Re-establish care    History of Present Illness  Brandi Fritz is a 66 y.o. female who presents to re-establish care. Pt with a personal h/o LEFT breast cancer followed by an incomplete course of XRT and she was unable to tolerate endocrine therapy. She is approaching 7 years out from treatment. 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 She has been undergoing lymphatic massage since around 2019 - she goes every 2 weeks but has noticed that over the past 4 months, it has not been lasting the full two weeks and she becomes symptomatic more quickly. She has been using a compression bra. She reports several years ago during a colonoscopy procedure a blood pressure cuff was placed on her LEFT arm which then prompted her to have some focal intermittent swelling the LEFT upper arm. She does have a compression sleeve. Pt denies an obvious breast lump, skin change or nipple discharge. She is up-to-date on breast imaging    She has a history of lumpectomy and SLN biopsy 08/28/15 for treatment of stage I left breast cancer. She stopped radiation therapy early due to concern over side effects (completed 23 treatments). She was referred to Dr Everardo All of medical oncology but was unable to tolerate AIs and declined Tamoxifen usage.    At her initial visit, she reports bilateral palpable breast lumps for which she underwent imaging with ultrasound, but reportedly refused mammogram. 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  She has a history of prior benign breast biopsies    Current Outpatient Medications   Medication Sig Dispense Refill    celecoxib (CELEBREX) 200 MG capsule Take 200 mg by mouth 2 times daily      cyclobenzaprine (FLEXERIL) 10 MG tablet 1 tablet Orally as needed      Esomeprazole Magnesium (NEXIUM) 10 MG PACK 1 tab Oral      fluconazole (DIFLUCAN) 200 MG tablet TAKE ONE TABLET BY MOUTH ONE TIME DAILY OR AS DIRECTED       acyclovir (ZOVIRAX) 800 MG tablet 1 capsule      clonazePAM (KLONOPIN) 0.5 MG tablet 1 tablet at bedtime Orally Once a day      gabapentin (NEURONTIN) 300 MG capsule 2 Tablets Orally once a day at bedtime      thyroid (ARMOUR THYROID) 90 MG tablet 1 tablet Orally Once a day for 90 days      traMADol (ULTRAM) 50 MG tablet 1 tablet Orally as needed       No current facility-administered medications for this visit.   Marland Kitchen  No Known Allergies   Past Medical History:   Diagnosis Date    Breast cancer (HCC)     left    Depression     Fibromyalgia     History of therapeutic radiation     Thyroid disease       Past Surgical History:   Procedure Laterality Date    BREAST ENHANCEMENT SURGERY Bilateral     BREAST LUMPECTOMY Left     CESAREAN SECTION      x2    HYSTERECTOMY (CERVIX STATUS UNKNOWN)      KNEE SURGERY Right 03/2011    TONSILLECTOMY        Cancer-related family history includes Cervical Cancer in her maternal aunt; Colon Cancer in her maternal grandmother; Stomach Cancer in her father.       Review of Systems    General/Constitutional:  Fever denies.  Chills denies.  Headache admits.  Fatigue admits.       Endocrine:           Weight gain denies.  Cold intolerance denies.  Heat intolerance admits.  Weight loss denies.       Respiratory:           Shortness of breath denies.  TB or exposure to TB denies.  Cough denies.  Wheezing denies.       Breast:           Breast lump denies.  Breast pain admits to at times.  Nipple discharge denies.  Red skin denies.       Cardiovascular:           Chest pain denies.  Leg/ankle Swelling denies.  Irregular heartbeat denies.       Gastrointestinal:           Abdominal pain denies.  Diarrhea denies.  Nausea denies.  Vomiting denies.       Musculoskeletal:           Back pain admits.  Muscle aches denies.  Painful joints admits.       Hematology:           Bleeding problems admits.  Enlarged Lymph nodes No.  Easy bruising admits.  Anemia denies.       Skin:           Rash  denies.  Skin cancer denies.  Skin lesion(s) denies.       Genitourinary:           Frequent urination admits.       Neurologic:           Seizures denies.  Dizziness denies.  Fainting denies.       Psychiatric:           Depression denies.  Panic attacks denies.  Anxiety admits.          PHYSICAL EXAMINATION:   General Examination:  Vitals:    04/28/22 1117   BP: 121/76   Pulse: 90   Resp: 16   SpO2: 99%              GENERAL APPEARANCE: well developed, well nourished, Patient is alert and oriented X 3. No acute distress. Appropriate affect, looks stated age.          HEAD: normocephalic, atraumatic.          NECK: neck supple, full range of motion.          LYMPH NODES:  no axillary, supraclavicular or cervical adenopathy.          BREASTS:  The patient was examined in the supine and upright positions.                    RIGHT Breast: periareolar scar present; the skin, nipple and areola are normal. No nipple discharge was able to be expressed today. There are no retractions or dimpling noted. There is generalized nodularity without a dominant mass. The axillary tail is normal. Implant present                    LEFT Breast: breast is smaller and rests higher in comparison to the RIGHT ; there is visible swelling involving the upper and UOQ; scar present in the upper breast, lateral breast and axilla; the skin, nipple and areola are normal. No nipple discharge was able to be expressed today. There are no  retractions or dimpling noted. There is generalized nodularity without a dominant mass - lymphedema changes present, most obvious in the upper and UOQ. The axillary tail is normal. Implant present         SKIN: good turgor, no rashes noted, warm and dry.          MUSCULOSKELETAL: full range of motion, no swelling or deformity.          EXTREMITIES: mild edema in the LEFT forearm; no clubbing or cyanosis.      IMAGING:   Mammogram Result (most recent):  MAM TOMO DIGITAL SCREEN AUGMENTED BILATERAL  04/05/2022    Narrative  SCREENING MAMMOGRAM: 04/05/22    INDICATION: Z12.31,Encounter for screening mammogram for malignant neoplasm of  breast,ICD-10-CM.    COMPARISON: 01/11/2021 and 01/31/2018    TECHNIQUE: BILATERAL 3D tomosynthesis and 2D digital mammography.    BREAST DENSITY: Heterogeneously dense, which may obscure small masses    FINDINGS:    There are bilateral retropectoral silicone implants which may lower the  sensitivity of mammography.    There are postsurgical changes in the left breast.    There are no suspicious masses or calcifications in either breast.    Impression  Post surgical changes in the left breast are benign. There is no specific  mammographic evidence of malignancy in either breast.    OVERALL BIRADS: 2: Benign    RECOMMENDATION(S):  Bilateral Screening Mammogram in 1 year              A result letter will be sent to the patient.    ------------------------------------------------------    ICD-10-CM    1. Personal history of breast cancer  Z85.3 US BREAST COMPLETE LEFT     US BREAST COMPLETE RIGHT      2. Dense breast tissue  R92.2 US BREAST COMPLETE LEFT     US BREAST COMPLETE RIGHT      3. Lymphedema of breast  I89.0       4. Visit for screening mammogram  Z12.31 MAM TOMO DIGITAL DIAGNOSTIC AUGMENTED BILATERAL      5. Swelling of left upper extremity  M79.89            IMPRESSION/RECOMMENDATION:    Brandi Fritz is a 66 y.o. woman here to re-establish care for her personal h/o breast cancer, her dense breast tissue and chronic LEFT breast lymphedema that is not currently responding as well to her lymphatic breast massage. Will place referral to [facility] for consideration of flexitouch  Discussed CBE and recent mammogram findings - no clinical evidence of recurrence. Pt did ask about the density of her breast tissue which we discussed in detail. Will add complete breast u/s to her mammogram next year which she may cancel pending cost.   I did spend some time discuss genetic  testing - we discussed pretest probability of normal, deleterious and VUS results. We discussed clinical implications and management strategies of all of these findings as well as their inheritance pattern. We also discussed her rights under the GINA act and the importance of a disclosure appointment to review results should testing be pursued. She has previously had 23&me testing. She will consider [facility] testing    A total of 30 minutes were spent on this case on the day of the encounter, including preparation to see the patient, review of her mammogram imaging, meeting the patient, discussion of care and orders and documenting the visit.      Refer to [facility] for consideration  of Flexitouch for left breast lymphedema  Continue breast surveillance  Consider genetic testing based on ASBS criteria  Screening mammo with tomo, complete right and left breast u/s in 1 year with same day followup for CBE

## 2022-05-05 ENCOUNTER — Telehealth

## 2022-05-05 NOTE — Telephone Encounter (Signed)
Pt is calling to ask about a Pump from [facility] called [employer] a Rep was supposed to call her and it has been a week. Vernona Rieger told her to call in if she hadnt heard from him. If someone could call pt back with more information please.

## 2022-05-06 NOTE — Telephone Encounter (Signed)
Mary with [employer] called regarding getting this pt a pump, mention it's more to it than just signing a prescription. She is requesting call back at [phone]

## 2022-05-06 NOTE — Telephone Encounter (Signed)
I called Brandi Fritz back. Placed order for pt to be referred to [facility] lymphedema as further documentation will be needed due to her insurance.  (Requested the [employer] per Brandi Fritz)  Brandi Fritz Brandi Frick PA-C

## 2022-05-13 NOTE — Telephone Encounter (Signed)
Pt called regarding her order for physical therapy,stated they only received the demographics and not the order, requesting the office to resubmit the order along with signature to [facility] located in [place], fax is [phone]

## 2022-06-08 NOTE — Progress Notes (Signed)
Formatting of this note is different from the original.  Physical Therapy Outpatient Evaluation  [facility]    Patient Name: Brandi Fritz    Date of Service: 06/08/22  Age: 66 y.o.      Funding: Payor: MEDICARE /  /  /   Sex: female      Primary Diagnosis: lymphedema     MRN: [id number]     Treatment Diagnosis:     ICD-10-CM ICD-9-CM    1. Lymphedema  I89.0 457.1    2. Lipedema  R60.9 782.3    3. Breast swelling  N63.0 611.72    4. Left arm pain  M79.602 729.5      Visit #:  1     Referring Provider: Valere Dross, PA  Location of Treatment: [facility]  Date Therapy Plan Established (29):  06/08/22  Date Patient First Became Aware of Symptoms (11): 05/09/22  Date Service Initiated by Billing Provider (55): 06/08/22  Date of Injury (11):     Date of Surgery (11):     Certification Dates:  06/08/22 to 09/06/22     History of Present Illness   History of Present Illness:  Brandi Fritz is a 66 y.o. year old female presenting with right arm, left arm, right leg , left leg, truncal, and breast   lymphedema that began in her legs as a young girl (lipedema) that worsened in 2023 after 1st knee replacement, swelling in [place] began after breast cancer treatments in 2016 (exacerbation after BP cuff being using on L during colonoscopy). 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 Pt's symptoms are described as RUE/RLE have always been larger, but now LLE is swelling, too. No appreciable distal swelling, but will fluctuate in feet and ankles, especially noticed after knee replacement surgery. LUE/breast began swelling in 2016 after L lumpectomy, SLNB, and XRT. Most swelling and fullness is in her breast and axilla, but swelling can travel into LUE at times. Sometimes she can barely lift her LUE at night because it is so heavy, tight and painful.  Weakness in LUE. Prior medical management includes: lymphatic massage done by massage therapist which helps, but only for a few days, compression sleeve which seems to help in the morning, but starts hurting later on. Aggravating factors include heat greatly increases swelling, swelling gets worse at end of day. Alleviating factors include MLD, compression. Pt would like to be more active, but feels limited due to her pain and swelling.     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  Cancer surgical history includes: L lumpectomy, SLNB 08/2015  Lymph nodes removed: 1-2   Chemotherapy history: na  Radiation history: 23 treatments, ended 2017  Onset of swelling: 2016/2017    MLD Contraindications   Unmedicated CHF/acute cardiac edema '[]'$ Contraindication   '[x]'$ No concerns  Comments:    Acute infection '[]'$ Contraindication   '[x]'$ No concerns  Comments:    Advanced Renal Disease '[]'$ Contraindication   '[x]'$ No concerns  Comments:    Acute DVT '[]'$ Contraindication   '[x]'$ No concerns  Comments:    Abdominal(pregnancy, AAA, menstruation, diverticulitis/osis, Chohn's, UC, recent surgery, irradiation, unexplained pain) '[]'$ Contraindication   '[x]'$ No concerns  Comments:    Neck (hyperthyroidism, hypersensitive carotid sinus, cardiac arrhythmia) '[]'$ Contraindication   '[x]'$ No concerns  Comments:    MLD Precautions   History of CHF/medically stable-needs medical clearance  '[]'$ Contraindication   [  x]No concerns  Comments:    Mild renal disease- needs medical clearance '[]'$ Contraindication   '[x]'$ No concerns  Comments:    Malignant lymphedema- needs medical clearance and relative to comfort for palliative care '[]'$ Contraindication   '[x]'$ No concerns  Comments:        Contraindications to Compression Bandaging   Unmedicated CHF/acute cardia edema '[]'$ Contraindication   '[x]'$ No concerns  Comments:    Acute infection '[]'$ Contraindication   '[x]'$ No concerns  Comments:    Advanced renal disease '[]'$ Contraindication   '[x]'$ No concerns  Comments:    Acute DVT '[]'$ Contraindication    '[x]'$ No concerns  Comments:    Advanced arterial disease (ABI <.8) '[]'$ Contraindication   '[x]'$ No concerns  Comments:    Precautions to Compression Bandaging   Diabetes '[]'$ Contraindication   '[x]'$ No concerns  Comments:    Limb paralysis  '[]'$ Contraindication   '[x]'$ No concerns  Comments:    Mild/medically managed HTN '[]'$ Contraindication   '[x]'$ No concerns  Comments:    Mild arterial disease-needs medical clearance  '[]'$ Contraindication   '[x]'$ No concerns  Comments:    Mild renal disease- needs medical clearance '[]'$ Contraindication   '[x]'$ No concerns  Comments:    History of CHF/medically stable-needs medical clearance  '[]'$ Contraindication   '[x]'$ No concerns  Comments:        Past Medical History:  has a past medical history of Anxiety, Cancer, and STD (sexually transmitted disease).  Past Surgical History:  has a past surgical history that includes Breast surgery; Cesarean section; and Knee surgery.  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  Medications:    Current Outpatient Medications:     clonazePAM (KLONOPIN) 0.5 MG tablet, TAKE 1 TABLET BY MOUTH TWICE DAILY AS NEEDED FOR ANXIETY, Disp: , Rfl: 2    cyclobenzaprine (FLEXERIL) 10 MG tablet, Take 10 mg by mouth 3 times daily., Disp: , Rfl:     fluconazole (DIFLUCAN) 200 MG tablet, Take one tablet by mouth every three days for three doses and then one weekly for six months, Disp: 15 tablet, Rfl: 1    gabapentin (NEURONTIN) 300 MG capsule, TAKE ONE CAPSULE BY MOUTH 3 TIMES A DAY, Disp: , Rfl: 6    levothyroxine (SYNTHROID) 88 MCG tablet, Take 88 mcg by mouth daily., Disp: , Rfl:     traMADol (ULTRAM) 50 mg tablet, TAKE 1 TABLET BY MOUTH EVERY 6 HOURS AS NEEDED, Disp: , Rfl: 1    Subjective     Subjective:  See HPI    Pain Pre Treatment: heaviness in L arm and breast  5-6/10, 8/10 at worst  Pain Post Treatment: NT/10    Patient's Goals: help with swelling, learn to manage   Objective   Functional Survey:  Date:   LLIS     06/08/22  89.7         Physical Exam   Below objective measures taken on initial evaluation unless otherwise noted:     Initial observations:    Skin condition : good skin condition, fullness/firmness in L upper breast and axilla    Lymphedema:    Hips: 105.4 cm (06/08/22)    Chest: 98.6 cm (06/08/22)     Upper Extremity R 06/08/22   Web space 19.6   Wrist 14.5   4 cm 15   8 cm 17.5   12 cm 20.8   16 cm 23   20  cm 23   Elbow 23   4 cm 25.9   8 cm 27.5   12 cm 29   16 cm 29.8   20 cm 31.4      Upper Extremity L 06/08/22   Web space 19   Wrist 14.6   4 cm 14.7   8 cm 16.9   12 cm 19.5   16 cm 22.3   20 cm 23   Elbow 23.5   4 cm 26.6   8 cm 27.7   12 cm 28   16 cm 27.8   20 cm 29      Lower Extremity R 06/08/22   Base of 5th met  22   Medial/lateral malleolus 23.5   Largest calf 39.8   10 cm     20 cm     30 cm    40 cm    50 cm    Knee  41.9   Largest thigh 53   10 cm    20 cm    30 cm    40 cm    50 cm    60 cm       Lower Extremity L 06/08/22   Base of 5th met  22   Medial/lateral malleolus 24   Largest calf 40.7   10 cm    20 cm     30 cm    40 cm    50 cm    Knee  41.6   Largest thigh 51.9   10 cm    20 cm    30 cm    40 cm    50 cm    60 cm      Manual Muscle Testing:    RIGHT LEFT   Shoulder abduction     Shoulder flexion     Shoulder IR     Shoulder ER     Middle trapezius     Lower trapezius     Serratus     Other:        Range of Motion: B shoulder WNL    Treatment Performed:    Evaluation as above    Manual Lymphatic Drainage (manual therapy) x 40 min:  - patient was educated re: lymphedema, the lymphatic system, complete decongestive therapy (CDT), including: manual lymphatic drainage, compression bandaging, skin care, and remedial exercise  - patient was educated on etiology of  lipedema vs lymphedema and how they can interact   - patient was educated  and advised on types of compression garments to wear including compression bra and potentially a new sleeve for LUE to don during more high risk activities such as flying, exercising, working outside (anything that can increase blood capillary pressure)   - educated and instructed in self manual lymphatic drainage for LUE and breast using interaxillary and L axilloinguinal anastomoses  - educated on purpose of flexitouch to perform MLD at home for daily management of lymphedema     HEP:  Current HEP updated on: 06/08/22   '[x]'$  Patient is working toward independent home exercise program    '[]'$   patient is independent and compliant with home exercise program   '[]'$  patient is not compliant with home exercise program Compression bra       Assessment     Clinical Assessment: Bayla Mcgovern is a 66 y.o. 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  Brandi Fritz demonstrates edema strength deficits  decreased activity tolerance . Pt has good potential to benefit from skilled physical therapy interventions to decongest lymphedema and learn all appropriate aspects of CDT to manage lymphedema independently. Treatment will consist of MLD, skin care, compression bandaging, progressive resistance training, ADL training, endurance training, pt education and fitting for compression garments. Physical therapy will address the evaluated body systems in order to make a functional difference and return the patient to prior level of function. Patient was provided with home exercise program and is working toward being independent and compliant with this. Extensive education provided on etiology of lymphedema and how lipedema and breast cancer treatments have contributed to swelling. Most of pt's swelling remains in her L breast and  axilla so instructed in shortened version of self MLD for truncal decongestion and provided with options for compression garments. Will plan to perform MLD at next appt for LUE and breast as this is source of most of pt's discomfort.     Prognosis / Rehab Potential: Good  Positive factors: motivated, family support, and education level  Negative factors: chronic    Next visit: MLD  Goals     Short Term Goals  To be met by 07/08/2022 Status: Date Met:    Patient will demonstrate independence and compliance with HEP in order to progress towards goals as noted.    '[]'$ Goal Not Met   '[]'$ Goals Progressing  '[]'$ Goal Met    Patient will require min assist with self-MLD and in order to decongest the UE. '[]'$ Goal Not Met   '[]'$ Goals Progressing  '[]'$ Goal Met    Patient will report less pain, fullness, tightness in L  breast in order to improve comfort and mobility and decrease pain. '[]'$ Goal Not Met   '[]'$ Goals Progressing  '[]'$ Goal Met      Long Term Goals  To be met by 09/06/22 Status: Date Met:    The patient will demonstrate ability to lift at least 10lbs overhead  in order to perform heavy household chores without pain or discomfort in LUE.  '[]'$ Goal Not Met   '[]'$ Goals Progressing  '[]'$ Goal Met    Patient will improve LLIS by 7.29 points or better to meet MCID and improve self rated disability. Brandi Fritz VALIDATION OF THE LYMPHEDEMA LIFE IMPACT SCALE (LLIS): A CONDITION-SPECIFIC MEASUREMENT TOOL FOR PERSONS WITH LYMPHEDEMA. Lymphology. 2015 Sep;48(3):128-38. PMID: [id number] '[]'$ Goal Not Met   '[]'$ Goals Progressing  '[]'$ Goal Met    Patient will be independent with all aspects of CDT in order to control lymphedema. '[]'$ Goal Not Met   '[]'$ Goals Progressing  '[]'$ Goal Met    Patient will have all necessary ordering information, supplies, and garments in order to manage lymphedema. '[]'$ Goal Not Met   '[]'$ Goals Progressing  '[]'$ Goal Met      Plan   Frequency: 1x/week  Duration: 09/06/22    Treatment Interventions:    strapping  '[x]'$     casting '[x]'$    Manual  therapy 97140  '[x]'$     Neuromuscular reeducation 97112  '[x]'$    Self care 97535  '[x]'$     Therapeutic activities 97530 '[x]'$    Therapeutic exercise 97110 '[x]'$    Mechanical traction 97012 '[x]'$    Gait training 97116  '[x]'$     Physical performance test 97750  '[x]'$    Iontophoresis 97033 '[x]'$    Ultrasound therapy 97035 '[x]'$    Unattended e-stim 97014  '[x]'$   attended e-stim [id number]  '[x]'$       Total treatment Time (in minutes): 60  Timed Codes (in minutes): 40  Untimed Codes (in minutes):  Micro, PT, DPT, CLT    Copy sent to referring physician/provider.  Thank you for the Physical Therapy referral and please call with any questions/concerns regarding this patient's care.  Our phone/fax numbers are as follows:  [facility]: [phone] /Fax: [phone]  [place]:  [phone]/Fax:  [place]: [phone]/Fax: [phone]    "I certify the need for these services furnished under this plan of treatment and while under my care."    Physician Signature:__________________________________Date______________    Physician Name (please print):___________________________________________      Electronically signed by Bobbye Morton, PT at 06/08/2022  3:38 PM EDT

## 2022-11-03 ENCOUNTER — Encounter

## 2023-01-11 ENCOUNTER — Encounter

## 2023-01-11 ENCOUNTER — Ambulatory Visit: Admit: 2023-01-11 | Discharge: 2023-01-11 | Payer: MEDICARE | Attending: Physician Assistant | Primary: Family Medicine

## 2023-01-11 DIAGNOSIS — R350 Frequency of micturition: Secondary | ICD-10-CM

## 2023-01-11 LAB — CULTURE, URINE: FINAL REPORT: 65000

## 2023-01-11 MED ORDER — CIPROFLOXACIN HCL 500 MG PO TABS
500 MG | ORAL_TABLET | Freq: Two times a day (BID) | ORAL | 0 refills | Status: AC
Start: 2023-01-11 — End: 2023-01-16

## 2023-01-11 NOTE — Patient Instructions (Signed)
We will call you with the results of your urine culture.

## 2023-01-11 NOTE — Progress Notes (Signed)
Lytzy Cozier (DOB:  Feb 15, 1956) is a 67 y.o. female, here for evaluation of the following chief complaint(s):  Follow-up (Frequency, urgency with urine. )      Assessment & Plan   ASSESSMENT/PLAN:  1. Urinary frequency  -     POC Urinalysis, Auto W/O Scope (81003)  -     Culture, Urine; Future  -     ciprofloxacin (CIPRO) 500 MG tablet; Take 1 tablet by mouth 2 times daily for 5 days, Disp-10 tablet, R-0Normal  2. Urinary urgency  -     POC Urinalysis, Auto W/O Scope (81003)  -     Culture, Urine; Future  -     ciprofloxacin (CIPRO) 500 MG tablet; Take 1 tablet by mouth 2 times daily for 5 days, Disp-10 tablet, R-0Normal  3. Urinary tract infection without hematuria, site unspecified      Orders Placed This Encounter   Procedures    Culture, Urine     Standing Status:   Future     Standing Expiration Date:   01/11/2024    POC Urinalysis, Auto W/O Scope (81003)      MDM  Number of Diagnoses or Management Options  Diagnosis management comments: Patient with urinary urgency and frequency.  Urinalysis slightly abnormal with positive leukocyte Estrace.  Patient will be placed on Cipro today.  Urine culture will be sent today.  No signs of pyelonephritis today.  Follow-up as needed.  We will call the patient with the results of the urine culture and if antibiotics need to be changed at that time we will do it then.        Results for orders placed or performed in visit on 01/11/23   POC Urinalysis, Auto W/O Scope (81003)   Result Value Ref Range    Color (UA POC) Yellow     Clarity (UA POC) Clear     Glucose, Urine, POC Negative     Bilirubin, Urine, POC Negative     Ketones, Urine, POC Negative     Specific Gravity, Urine, POC 1.020 1.001 - 1.035    Blood (UA POC) Negative     pH, Urine, POC 7.0 4.6 - 8.0    Protein, Urine, POC Negative     Urobilinogen, POC 0.2 mg/dL     Nitrite, Urine, POC Negative     Leukocyte Esterase, Urine, POC Small         Return if symptoms worsen or fail to improve.         Subjective    SUBJECTIVE/OBJECTIVE:  This is a 67 year old female who comes in today complaining of possible UTI.  For the past 3 days the patient has been having urinary urgency, frequency, and some burning.  She says she is also felt somewhat tired.  Denies fever.  Has some lower back pain but no abdominal pain.  Last UTI was about 1 year ago.  Patient states that she will be leaving in the next couple days to go to [place] to go to a mother-in-law funeral.      Allergies   Allergen Reactions    Penicillins       Current Outpatient Medications   Medication Sig Dispense Refill    ciprofloxacin (CIPRO) 500 MG tablet Take 1 tablet by mouth 2 times daily for 5 days 10 tablet 0    celecoxib (CELEBREX) 200 MG capsule Take 1 capsule by mouth 2 times daily      cyclobenzaprine (FLEXERIL) 10 MG tablet 1 tablet Orally as  needed      Esomeprazole Magnesium (NEXIUM) 10 MG PACK 1 tab Oral      fluconazole (DIFLUCAN) 200 MG tablet TAKE ONE TABLET BY MOUTH ONE TIME DAILY OR AS DIRECTED      acyclovir (ZOVIRAX) 800 MG tablet 1 capsule      clonazePAM (KLONOPIN) 0.5 MG tablet 1 tablet at bedtime Orally Once a day      gabapentin (NEURONTIN) 300 MG capsule 2 Tablets Orally once a day at bedtime      thyroid (ARMOUR THYROID) 90 MG tablet 1 tablet Orally Once a day for 90 days      traMADol (ULTRAM) 50 MG tablet 1 tablet Orally as needed       No current facility-administered medications for this visit.        Past Medical History:   Diagnosis Date    Breast cancer (HCC)     left    Depression     Fibromyalgia     History of therapeutic radiation     Thyroid disease       Past Surgical History:   Procedure Laterality Date    BREAST ENHANCEMENT SURGERY Bilateral     BREAST LUMPECTOMY Left     CESAREAN SECTION      x2    HYSTERECTOMY (CERVIX STATUS UNKNOWN)      KNEE SURGERY Right 03/2011    TONSILLECTOMY        Family History   Problem Relation Age of Onset    Stomach Cancer Father     Colon Cancer Maternal Grandmother     Cervical Cancer  Maternal Aunt       Social History     Tobacco Use    Smoking status: Never    Smokeless tobacco: Never   Substance Use Topics    Alcohol use: Never    Drug use: Never        Review of Systems   Constitutional:  Negative for chills, fatigue and fever.   Gastrointestinal:  Negative for abdominal pain.   Genitourinary:  Positive for dysuria, frequency and urgency. Negative for difficulty urinating, flank pain, hematuria and pelvic pain.   All other systems reviewed and are negative.           Objective   BP 116/81   Pulse 79   Temp 98.5 F (36.9 C)   Wt 72.1 kg (159 lb)   SpO2 98%   BMI 24.54 kg/m     Physical Exam  Vitals and nursing note reviewed.   Constitutional:       General: She is not in acute distress.     Appearance: Normal appearance. She is normal weight. She is not ill-appearing, toxic-appearing or diaphoretic.   HENT:      Head: Normocephalic and atraumatic.   Cardiovascular:      Rate and Rhythm: Normal rate and regular rhythm.      Heart sounds: Normal heart sounds. No murmur heard.  Pulmonary:      Effort: Pulmonary effort is normal. No respiratory distress.   Abdominal:      General: Bowel sounds are normal. There is no distension.      Palpations: Abdomen is soft.      Tenderness: There is no abdominal tenderness. There is no right CVA tenderness or left CVA tenderness.   Skin:     General: Skin is warm and dry.   Neurological:      Mental Status: She is alert.  An electronic signature was used to authenticate this note.    --Curt Bears Dodger Sinning, PA-C

## 2023-01-11 NOTE — Other (Signed)
UA: small leuk est, otherwise normal.   Results given to pt in OV today.

## 2023-01-12 NOTE — Other (Signed)
Contaminated urine.

## 2023-05-01 ENCOUNTER — Encounter: Primary: Family Medicine

## 2023-05-01 ENCOUNTER — Ambulatory Visit: Payer: PRIVATE HEALTH INSURANCE | Primary: Family Medicine

## 2023-05-01 ENCOUNTER — Telehealth

## 2023-05-01 ENCOUNTER — Ambulatory Visit: Payer: MEDICARE | Primary: Family Medicine

## 2023-05-01 DIAGNOSIS — R9230 Dense breasts, unspecified: Secondary | ICD-10-CM

## 2023-05-01 NOTE — Telephone Encounter (Signed)
05/01/2023 phoned pt and left a message to call me back so we can review her imagine performed earlier today. She is being recommended for an u/s guided core biopsy.  Vernona Rieger Sulayman Manning PA-C    Left:  -Mass in the left breast 12:00 position located 5 cm from the nipple measuring   1.0 x 0.4 x 1.0 cm.     IMPRESSION:     Mass in the left breast 12:00 position measuring up to 1.0 cm is suspicious. No   definite mammographic correlate is identified on same-day diagnostic mammogram.   Recommend ultrasound-guided biopsy.     There is no specific sonographic evidence of malignancy in the right breast.     OVERALL BIRADS: 4: Suspicious     RECOMMENDATION(S):  LEFT Ultrasound - Guided Breast Biopsy

## 2023-05-02 ENCOUNTER — Encounter: Attending: Physician Assistant | Primary: Family Medicine

## 2023-05-03 ENCOUNTER — Encounter: Payer: PRIVATE HEALTH INSURANCE | Attending: Physician Assistant | Primary: Family Medicine

## 2023-05-08 ENCOUNTER — Inpatient Hospital Stay: Admit: 2023-05-08 | Payer: MEDICARE | Primary: Family Medicine

## 2023-05-08 ENCOUNTER — Encounter

## 2023-05-08 DIAGNOSIS — N6012 Diffuse cystic mastopathy of left breast: Secondary | ICD-10-CM

## 2023-05-08 DIAGNOSIS — R928 Other abnormal and inconclusive findings on diagnostic imaging of breast: Secondary | ICD-10-CM

## 2023-05-08 DIAGNOSIS — N6325 Unspecified lump in the left breast, overlapping quadrants: Secondary | ICD-10-CM

## 2023-05-08 MED ORDER — LIDOCAINE-EPINEPHRINE 1 %-1:100000 IJ SOLN
1 | Freq: Once | INTRAMUSCULAR | Status: AC
Start: 2023-05-08 — End: 2023-05-08
  Administered 2023-05-08: 14:00:00 20 mL via INTRADERMAL

## 2023-05-08 NOTE — Telephone Encounter (Signed)
Pt called to inform Vernona Rieger that she had her breast biopsy done this morning at 9am with Dr. Allegra Grana.

## 2023-05-09 NOTE — Telephone Encounter (Signed)
Phoned pt with concordance.  She will follow-up in 6 months for a CBE so we can then place orders for next year's imaging -she would like to continue with complete right and left breast u/s at time of mammogram.  Mickie Bail PA-C

## 2023-05-09 NOTE — Telephone Encounter (Signed)
SCHED 11/08/22@1030  IN [place] WITH LAURA  CONFIRMED W PT

## 2023-05-09 NOTE — Telephone Encounter (Signed)
Phoned pt with benign pathology results. I am waiting on concordance from radiology and will notify pt accordingly  Mickie Bail PA-C

## 2023-05-17 ENCOUNTER — Ambulatory Visit: Admit: 2023-05-17 | Discharge: 2023-05-17 | Payer: MEDICARE | Attending: Physician Assistant | Primary: Family Medicine

## 2023-05-17 DIAGNOSIS — Z853 Personal history of malignant neoplasm of breast: Secondary | ICD-10-CM

## 2023-05-17 NOTE — Progress Notes (Signed)
BREAST SURGERY FOLLOW UP    05/17/2023      Chief Complaint   Patient presents with    Follow-up     Early visit, check after biopsy        HPI:   Current Status:  Brandi Fritz is a 67 y.o. female who presents today for early visit. She recently underwent a left core biopsy which returned benign. Reports it was swollen at biopsy site, but has since resolved. Denies palpable mass, nipple discharge, or other skin changes. At times she will feel decreased ROM/tightness in left shoulder region, improves with stretching.    Personal h/o LEFT breast cancer followed by an incomplete course of XRT and she was unable to tolerate endocrine therapy.  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 She has been undergoing lymphatic massage since around 2019 - she goes every 2 weeks. She has been using a compression bra. She reports several years ago during a colonoscopy procedure a blood pressure cuff was placed on her LEFT arm which then prompted her to have some focal intermittent swelling the LEFT upper arm. She does have a compression sleeve. Pt denies an obvious breast lump, skin change or nipple discharge. She is up-to-date on breast imaging     She has a history of lumpectomy and SLN biopsy 08/28/15 for treatment of stage I left breast cancer, IDC. She stopped radiation therapy early due to concern over side effects (completed 23 treatments). She was referred to Dr Everardo All of medical oncology but was unable to tolerate AIs and declined Tamoxifen usage.    Pathology:  05/08/2023,  Heart shaped biopsy marker appropriately positioned. Pathology result is benign and concordant.    Recent Imaging:  05/01/2023, bilateral diagnostic mammogram: heterogeneously dense, no suspicious mammographic findings    05/01/2023, bilateral complete breast ultrasound: left breast mass 12:00 5 cmfn, measuring 1 x 0.4 x 1.0 cm    05/08/2023, LEFT breast core biopsy:  Heart shaped biopsy marker appropriately positioned.  Pathology result is benign and concordant.    Prior to Admission medications    Medication Sig Start Date End Date Taking? Authorizing Provider   cyclobenzaprine (FLEXERIL) 10 MG tablet 1 tablet Orally as needed    [provider]   Esomeprazole Magnesium (NEXIUM) 10 MG PACK 1 tab Oral    [provider]   fluconazole (DIFLUCAN) 200 MG tablet TAKE ONE TABLET BY MOUTH ONE TIME DAILY OR AS DIRECTED 03/21/21   [provider]   acyclovir (ZOVIRAX) 800 MG tablet 1 capsule    [facility], [facility], MD   clonazePAM (KLONOPIN) 0.5 MG tablet 1 tablet at bedtime Orally Once a day    [facility], [facility], MD   gabapentin (NEURONTIN) 300 MG capsule 2 Tablets Orally once a day at bedtime    [facility], [facility], MD   thyroid (ARMOUR THYROID) 90 MG tablet 1 tablet Orally Once a day for 90 days    [facility]conciliation, [facility], MD   traMADol (ULTRAM) 50 MG tablet 1 tablet Orally as needed    [facility]conciliation, [facility], MD      Allergies   Allergen Reactions    Penicillins     Sulfa Antibiotics Rash      Past Medical History:   Diagnosis Date    Breast cancer (HCC)     left    Depression     Fibromyalgia     History of therapeutic radiation  Thyroid disease       Past Surgical History:   Procedure Laterality Date    BREAST ENHANCEMENT SURGERY Bilateral     BREAST LUMPECTOMY Left     CESAREAN SECTION      x2    HYSTERECTOMY (CERVIX STATUS UNKNOWN)      KNEE SURGERY Right 03/2011    TONSILLECTOMY      US BREAST BIOPSY W LOC DEVICE 1ST LESION LEFT Left 05/08/2023    US BREAST BIOPSY W LOC DEVICE 1ST LESION LEFT 05/08/2023 [facility] MAMMO      Cancer-related family history includes Cervical Cancer in her maternal aunt; Colon Cancer in her maternal grandmother; Stomach Cancer in her father.   Social History     Socioeconomic History    Marital status: Married     Spouse name: None    Number of children: None    Years of education: None    Highest  education level: None   Tobacco Use    Smoking status: Never    Smokeless tobacco: Never   Substance and Sexual Activity    Alcohol use: Never    Drug use: Never          Review of Systems    General/Constitutional:           Fever denies.  Chills denies.  Headache denies.  Fatigue denies.       Breast:           Breast lump denies.  Breast pain denies.  Nipple discharge denies.  Red skin denies.     SEE HPI  Musculoskeletal:           Back pain denies.  Muscle aches denies.  Painful joints denies.       Skin:           Rash denies.  Skin cancer denies.  Skin lesion(s) denies.               General Examination:  Ht 1.715 m (5' 7.5")   Wt 72.1 kg (159 lb)   BMI 24.54 kg/m             GENERAL APPEARANCE: well developed, well nourished, Patient is alert and oriented X 3. No acute distress. Appropriate affect, looks stated age.          HEAD: normocephalic, atraumatic.          NECK: neck supple, full range of motion.          LYMPH NODES:  no axillary, supraclavicular or cervical adenopathy.          BREASTS:  The patient was examined in the supine and upright positions.                    RIGHT Breast: periareolar scar present; the skin, nipple and areola are normal. No nipple discharge was able to be expressed today. There are no retractions or dimpling noted. There is generalized nodularity without a dominant mass. The axillary tail is normal. Implant present                    LEFT Breast: breast is smaller and rests higher in comparison to the RIGHT, scar present in the upper breast, lateral breast and axilla; lateral breast has resolving ecchymosis at biopsy site, the skin, nipple and areola are otherwise normal. No nipple discharge was able to be expressed today. There are no retractions or dimpling noted. There is generalized nodularity without a  dominant mass. The axillary tail is normal. Implant present          SKIN: good turgor, no rashes noted, warm and dry.          MUSCULOSKELETAL: full range of  motion, no swelling or deformity.          EXTREMITIES: no edema, clubbing or cyanosis.     Radiology:  DIAGNOSTIC MAMMOGRAM: 05/01/23     INDICATION: History of left breast cancer status post conservation therapy.. No   concerns on examination today     COMPARISON: 04/05/2022, 01/31/2018     TECHNIQUE: BILATERAL 3D tomosynthesis and 2D digital mammography.     BREAST DENSITY: The breasts are heterogeneously dense, which may obscure small   masses.     MAMMOGRAM:     There are bilateral retropectoral silicone implants which likely lowers the   sensitivity of mammography.     There is no suspicious mass or calcification in either breast.     There are postsurgical changes in the left breast.     IMPRESSION:     Postsurgical changes in the left breast are benign. There is no specific   mammographic evidence of malignancy in either breast.     OVERALL BIRADS: 2: Benign     RECOMMENDATION(S):  BILATERAL Screening Mammogram in 1 year  ----------------------------------------------------------------------------------------------------------------------  COMPLETE BILATERAL BREAST ULTRASOUND SCREENING: 05/01/23     INDICATION: Z85.3,Personal history of malignant neoplasm of breast,ICD-10-CM   supplemental imaging due to breast tissue density Supplemental breast screening.     COMPARISON: 05/01/2023, 04/05/2022, 01/11/2021     ULTRASOUND:  Complete breast ultrasound was performed by the sonographer. All 4 quadrants and   the retroareolar regions were scanned.      Right: No suspicious masses or associated features.     Left:  -Mass in the left breast 12:00 position located 5 cm from the nipple measuring   1.0 x 0.4 x 1.0 cm.     IMPRESSION:     Mass in the left breast 12:00 position measuring up to 1.0 cm is suspicious. No   definite mammographic correlate is identified on same-day diagnostic mammogram.   Recommend ultrasound-guided biopsy.     There is no specific sonographic evidence of malignancy in the right breast.     OVERALL  BIRADS: 4: Suspicious     RECOMMENDATION(S):  LEFT Ultrasound - Guided Breast Biopsy  ----------------------------------------------------------------------------------------------------------------------  ULTRASOUND GUIDED BREAST BIOPSY: 05/08/23     INDICATION: Left breast mass     COMPARISON: 05/01/2023     PROCEDURE:  The procedure and associated risks were discussed in full with the patient. Both   oral and written consents were obtained. A time-out was performed immediately   prior to the procedure. The patient was positioned on the ultrasound table and   the area was localized sonographically.      The skin site over the target was cleansed with chlorhexidine. 5 mL of 1%   lidocaine and 1:100,000 epinephrine was used for local anesthesia. A skin nick   was made with a scalpel. A 12-gauge Celero spring-loaded core needle biopsy   device was used and its accurate position confirmed with pre- and post- fire   imaging.     TARGET: Left breast 12:00 mass      Biopsy approach was from lateral.  2 core samples obtained.  BIOPSY MARKER: Heart shaped biopsy marker placed at the biopsy site.     All specimens were placed in formalin and sent  to pathology for analysis. After   the procedure, pressure was applied until hemostasis was achieved. The area was   cleansed and Steri-Strips applied. No immediate complications.     POST PROCEDURE MAMMOGRAM:   A post biopsy mammogram showed the biopsy marker at the intended biopsy site.     Postbiopsy instructions were given verbally and in writing. Patient tolerated   the procedure well and left the department in stable condition.     IMPRESSION:  Ultrasound guided biopsy of a left breast 12:00 mass. Heart shaped biopsy marker   appropriately positioned. Pathology result is benign and concordant. Routine   screening mammogram in 1 year is recommended.     PATHOLOGY RESULT:   Left breast, 12:00, 5 cm from nipple, mass, ultrasound-guided core biopsy:  Fibrocystic disease,  proliferative type, with nodular adenosis, sclerosing   adenosis,    and microcalcifications.     The significant results and recommendations were sent to [facility] via   [employer] on 05/09/2023        OVERALL ASSESSMENT: Benign     RECOMMENDATION(S):  BILATERAL Screening Mammogram in 1 year  ----------------------------------------------------------------------------------------------------------------------      Assessment:    ICD-10-CM    1. Personal history of breast cancer  Z85.3       2. S/P breast biopsy  Z98.890           Plan:   67 y.o. woman presents for early visit regarding breast swelling after biopsy. Her symptoms have essentially resolved and exam shows resolving ecchymosis with no swelling or mass. Recommend she continue monitor, follow up with Vernona Rieger in 6 months as previously scheduled. We reviewed ROM exercises, if not better then discussed patient reaching out and would be happy to send referral to PT. Return sooner if needed. Patient is in agreement with plan.     I spent a total of 20 minutes reviewing the records, imaging studies, reports and counseling the patient regarding the recommended treatment plan for her breast care and documentation in patient record.    Joline Salt, PA-C

## 2023-08-30 ENCOUNTER — Encounter: Admit: 2023-08-30 | Payer: MEDICARE | Admitting: Surgical | Primary: Family Medicine

## 2023-08-30 VITALS — BP 125/75 | HR 90 | Resp 16 | Ht 67.5 in | Wt 168.0 lb

## 2023-08-30 DIAGNOSIS — N644 Mastodynia: Principal | ICD-10-CM

## 2023-08-30 NOTE — Progress Notes (Signed)
 BREAST SURGERY FOLLOW UP    08/30/2023      Chief Complaint   Patient presents with    Follow-up     Left breast pain        HPI:   Current Status:  Brandi Fritz is a 67 y.o. female who presents today for early visit. She has been having pain in her left

## 2023-09-04 ENCOUNTER — Encounter: Admit: 2023-09-04 | Admitting: Surgical

## 2023-09-04 ENCOUNTER — Ambulatory Visit
Admit: 2023-09-04 | Discharge: 2023-09-05 | Disposition: A | Discharge status: Home / Self Care | Payer: MEDICARE | Attending: Surgical | Admitting: Surgical | Primary: Family Medicine

## 2023-09-04 DIAGNOSIS — N644 Mastodynia: Secondary | ICD-10-CM

## 2023-11-09 ENCOUNTER — Ambulatory Visit: Admit: 2023-11-09 | Discharge: 2023-11-09 | Payer: MEDICARE | Attending: Physician Assistant | Primary: Family Medicine

## 2023-11-09 VITALS — BP 124/77 | HR 84 | Resp 16 | Ht 67.5 in | Wt 171.0 lb

## 2023-11-09 DIAGNOSIS — Z853 Personal history of malignant neoplasm of breast: Secondary | ICD-10-CM

## 2023-11-09 NOTE — Progress Notes (Signed)
 BREAST SURGERY FOLLOW UP      HPI:   Current Status:  Brandi Fritz is a 68 y.o. female who presents today for early surveillance. She saw Adrienne Mocha PA-C in early 08/2023 regarding pain in her left breast.  She was sent for u/s at that time on 09/04/2023 which revealed no solid or cystic lesions, no findings in the areas of pain in the LEFT breast and low axilla (pt declined mammogram). Pt presents today and feels the pain is better (she has been taking vitamin E and iodine). She continues therapy with the lymphedema team and has a pump at home she uses. She was unable to get the travel pump approved by insurance. She denies any current breast complaints. She does report LEFT hip pain over the past year    Personal h/o LEFT breast cancer followed by an incomplete course of XRT and she was unable to tolerate endocrine therapy.  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 She has been undergoing lymphatic massage since around 2019 - she goes every 2 weeks. She has been using a compression bra. She reports several years ago during a colonoscopy procedure a blood pressure cuff was placed on her LEFT arm which then prompted her to have some focal intermittent swelling the LEFT upper arm. She does have a compression sleeve.      She has a history of lumpectomy and SLN biopsy 08/28/15 for treatment of stage I left breast cancer, IDC. She stopped radiation therapy early due to concern over side effects (completed 23 treatments). She was referred to Dr Everardo All of medical oncology but was unable to tolerate AIs and declined Tamoxifen usage.    Recent Workup:  05/01/2023, bilateral diagnostic mammogram: heterogeneously dense, no suspicious mammographic findings    05/01/2023, bilateral complete breast ultrasound: left breast mass 12:00 5 cmfn, measuring 1 x 0.4 x 1.0 cm    05/08/2023, LEFT breast core biopsy:  Fibrocystic disease, proliferative type, with nodular adenosis, sclerosing adenosis,  and microcalcifications. Heart shaped biopsy marker appropriately positioned. Pathology result is benign and concordant.    Prior to Admission medications    Medication Sig Start Date End Date Taking? Authorizing Provider   acyclovir (ZOVIRAX) 400 MG tablet TAKE ONE TO TWO TABLETS BY MOUTH ONE TIME DAILY OR AS DIRECTED 09/04/23  Yes [provider]   TYRVAYA 0.03 MG/ACT SOLN INSTILL 1 SPRAY IN EACH NOSTRIL TWICE DAILY (EVERY 12 HOURS). 08/01/23   [provider]   omeprazole (PRILOSEC) 40 MG delayed release capsule  08/02/23   [provider]   celecoxib (CELEBREX) 200 MG capsule  08/04/23   [provider]   amphetamine-dextroamphetamine (ADDERALL XR) 20 MG extended release capsule  07/20/23   [provider]   levothyroxine (SYNTHROID) 100 MCG tablet  07/30/23   [provider]   cyclobenzaprine (FLEXERIL) 10 MG tablet 1 tablet Orally as needed    [provider]   Esomeprazole Magnesium (NEXIUM) 10 MG PACK 1 tab Oral    [provider]   fluconazole (DIFLUCAN) 200 MG tablet TAKE ONE TABLET BY MOUTH ONE TIME DAILY OR AS DIRECTED 03/21/21   [provider]   clonazePAM (KLONOPIN) 0.5 MG tablet 1 tablet at bedtime Orally Once a day    [facility]conciliation, [facility], MD   gabapentin (NEURONTIN) 300 MG capsule 2 Tablets Orally once a day at bedtime    [facility] Automatic Reconciliation, [facility], MD   thyroid (ARMOUR  THYROID) 90 MG tablet 1 tablet Orally Once a day for 90 days    [facility] Automatic Reconciliation, [facility], MD   traMADol (ULTRAM) 50 MG tablet 1 tablet Orally as needed    [facility] Automatic Reconciliation, [facility], MD      Allergies   Allergen Reactions    Penicillins     Sulfa Antibiotics Rash      Past Medical History:   Diagnosis Date    Breast cancer (HCC)     left    Depression     Fibromyalgia     History of therapeutic radiation     Thyroid disease       Past Surgical History:   Procedure Laterality Date    BREAST ENHANCEMENT SURGERY  Bilateral     BREAST LUMPECTOMY Left     CESAREAN SECTION      x2    HYSTERECTOMY (CERVIX STATUS UNKNOWN)      KNEE SURGERY Right 03/2011    TONSILLECTOMY      US BREAST BIOPSY W LOC DEVICE 1ST LESION LEFT Left 05/08/2023    US BREAST BIOPSY W LOC DEVICE 1ST LESION LEFT 05/08/2023 [facility]      Cancer-related family history includes Cervical Cancer in her maternal aunt; Colon Cancer in her maternal grandmother; Stomach Cancer in her father.   Social History     Socioeconomic History    Marital status: Married     Spouse name: None    Number of children: None    Years of education: None    Highest education level: None   Tobacco Use    Smoking status: Never    Smokeless tobacco: Never   Substance and Sexual Activity    Alcohol use: Never    Drug use: Never          Review of Systems    General/Constitutional:           Fever denies.  Chills denies.  Headache denies.  Fatigue denies.       Breast:           Breast lump denies.  Breast pain admits.  Nipple discharge denies.  Red skin denies.     SEE HPI  Musculoskeletal:           Back pain denies.  Muscle aches admits.  Painful joints admits.       Skin:           Rash denies.  Skin cancer denies.  Skin lesion(s) denies.               General Examination:  BP 124/77   Pulse 84   Resp 16   Ht 1.715 m (5' 7.5")   Wt 77.6 kg (171 lb)   SpO2 99%   BMI 26.39 kg/m             GENERAL APPEARANCE: well developed, well nourished, Patient is alert and oriented X 3. No acute distress. Appropriate affect, looks stated age.          HEAD: normocephalic, atraumatic.          NECK: neck supple, full range of motion.          LYMPH NODES:  no axillary, supraclavicular or cervical adenopathy.          BREASTS:  The patient was examined in the supine and upright positions.                    RIGHT  Breast: periareolar scar present; the skin, nipple and areola are normal. No nipple discharge was able to be expressed today. There are no retractions or dimpling noted. There is  generalized nodularity without a dominant mass. The axillary tail is normal. Implant present                    LEFT Breast: breast is smaller and rests higher in comparison to the RIGHT, scar present in the upper breast, lateral breast and axilla; the skin, nipple and areola are otherwise normal. No nipple discharge was able to be expressed today. There are no retractions or dimpling noted. There is generalized nodularity without a dominant mass. The axillary tail is normal. Implant present          SKIN: good turgor, no rashes noted, warm and dry.          MUSCULOSKELETAL: full range of motion, no swelling or deformity.          EXTREMITIES: no edema, clubbing or cyanosis.     Radiology:  DIAGNOSTIC BREAST ULTRASOUND: 09/04/23     INDICATION: N64.4,Mastodynia,ICD-10-CM. diffuse pain in left breast and axilla;   pt refused diagnostic mammogram     COMPARISON: Prior breast imaging studies the most recent dated 05/08/2023     ULTRASOUND:  Targeted breast ultrasound was performed by the sonographer.         Left: No solid or cystic lesion. No suspicious findings in the areas of pain at approximately the 10:00 position and in the low axilla.     IMPRESSION:  No suspicious findings on the focused ultrasound assessment of the left breast.     OVERALL BIRADS: 1: Negative     RECOMMENDATION(S):  BILATERAL Return to Annual Screening Mammogram   ----------------------------------------------------------------------------------------------------------------------      Assessment:    ICD-10-CM    1. Personal history of breast cancer  Z85.3 US BREAST COMPLETE LEFT     US BREAST COMPLETE RIGHT      2. Dense breast tissue  R92.30 US BREAST COMPLETE LEFT     US BREAST COMPLETE RIGHT      3. Lymphedema of breast  I89.0       4. Visit for screening mammogram  Z12.31 MAM TOMO DIGITAL SCREEN BILATERAL      5. Lymphedema of left upper extremity  I89.0           Plan:   68 y.o. woman presents for early surveillance regarding left breast  pain which she actually feels has improved. Discussed benign CBE findings from today along with the u/s performed in 08/2023. She is requesting updating prescriptions be sent to [facility] for compression bras and a new LEFT upper extremity sleeve. Will send Rx. Will proceed with routine mammogram and complete right /left breast u/s in 04/2024 with same day follow-up for CBE. Will send referral to ortho regarding hip pain.    I spent a total of 20 minutes reviewing the records, imaging studies, reports and counseling the patient regarding the recommended treatment plan for her breast care and documentation in patient record.

## 2023-11-10 NOTE — Addendum Note (Signed)
 Addended by: Mickie Bail on: 11/10/2023 03:12 PM     Modules accepted: Orders

## 2024-01-10 NOTE — Telephone Encounter (Signed)
 REFAXED TO [facility]

## 2024-04-05 ENCOUNTER — Other Ambulatory Visit: Payer: Self-pay | Admitting: Endocrinology

## 2024-04-05 DIAGNOSIS — E042 Nontoxic multinodular goiter: Secondary | ICD-10-CM

## 2024-05-03 ENCOUNTER — Inpatient Hospital Stay: Admit: 2024-05-03 | Payer: MEDICARE | Primary: Family Medicine

## 2024-05-03 ENCOUNTER — Encounter: Attending: Physician Assistant | Primary: Family Medicine

## 2024-05-03 ENCOUNTER — Encounter

## 2024-05-03 DIAGNOSIS — Z1231 Encounter for screening mammogram for malignant neoplasm of breast: Principal | ICD-10-CM

## 2024-05-03 DIAGNOSIS — Z853 Personal history of malignant neoplasm of breast: Principal | ICD-10-CM

## 2024-05-08 ENCOUNTER — Ambulatory Visit: Admit: 2024-05-08 | Discharge: 2024-05-08 | Payer: MEDICARE | Attending: Surgical | Primary: Family Medicine

## 2024-05-08 VITALS — Ht 67.5 in | Wt 170.0 lb

## 2024-05-08 DIAGNOSIS — Z853 Personal history of malignant neoplasm of breast: Principal | ICD-10-CM

## 2024-05-08 NOTE — Progress Notes (Signed)
 BREAST SURGERY FOLLOW UP      HPI:   Current Status:  Brandi Fritz is a 68 y.o. female who presents today for surveillance. She continues therapy with the lymphedema team and has a pump at home she uses. She was unable to get the travel pump approved by insurance but did pay for one out of pocket. She denies any current breast complaints. She does report LEFT hip pain over the past year. She was referred to orthopedics at her last visit but never went to see them.    Personal h/o LEFT breast cancer followed by an incomplete course of XRT and she was unable to tolerate endocrine therapy.  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 She has been undergoing lymphatic massage since around 2019 - she goes every 2 weeks. She has been using a compression bra. She reports several years ago during a colonoscopy procedure a blood pressure cuff was placed on her LEFT arm which then prompted her to have some focal intermittent swelling the LEFT upper arm. She does have a compression sleeve.      She has a history of lumpectomy and SLN biopsy 08/28/15 for treatment of stage I left breast cancer, IDC. She stopped radiation therapy early due to concern over side effects (completed 23 treatments). She was referred to Dr Kassie of medical oncology but was unable to tolerate AIs and declined Tamoxifen usage.    Recent Workup:  05/03/2024, bilateral screening mammogram: heterogeneously dense, no suspicious mammographic findings    05/03/2024, bilateral complete breast ultrasound: No suspicious finding in either breast by ultrasound         Prior to Admission medications   Medication Sig Start Date End Date Taking? Authorizing Provider   acyclovir (ZOVIRAX) 400 MG tablet TAKE ONE TO TWO TABLETS BY MOUTH ONE TIME DAILY OR AS DIRECTED 09/04/23   [provider]   TYRVAYA 0.03 MG/ACT SOLN INSTILL 1 SPRAY IN EACH NOSTRIL TWICE DAILY (EVERY 12 HOURS). 08/01/23   [provider]   omeprazole  (PRILOSEC) 40 MG delayed release capsule  08/02/23   [provider]   celecoxib (CELEBREX) 200 MG capsule  08/04/23   [provider]   amphetamine-dextroamphetamine (ADDERALL XR) 20 MG extended release capsule  07/20/23   [provider]   levothyroxine (SYNTHROID) 100 MCG tablet  07/30/23   [provider]   cyclobenzaprine (FLEXERIL) 10 MG tablet 1 tablet Orally as needed    [provider]   Esomeprazole Magnesium (NEXIUM) 10 MG PACK 1 tab Oral    [provider]   fluconazole (DIFLUCAN) 200 MG tablet TAKE ONE TABLET BY MOUTH ONE TIME DAILY OR AS DIRECTED 03/21/21   [provider]   clonazePAM (KLONOPIN) 0.5 MG tablet 1 tablet at bedtime Orally Once a day    [facility] Automatic Reconciliation, [facility], MD   gabapentin (NEURONTIN) 300 MG capsule 2 Tablets Orally once a day at bedtime    [facility] Automatic Reconciliation, [facility], MD   thyroid (ARMOUR THYROID) 90 MG tablet 1 tablet Orally Once a day for 90 days    [facility] Automatic Reconciliation, [facility], MD   traMADol (ULTRAM) 50 MG tablet 1 tablet Orally as needed    [facility] Automatic Reconciliation, [facility], MD      Allergies   Allergen Reactions    Penicillins     Sulfa Antibiotics Rash      Past Medical History:   Diagnosis Date  Breast cancer (HCC)     left    Depression     Fibromyalgia     History of therapeutic radiation     Thyroid disease       Past Surgical History:   Procedure Laterality Date    BREAST ENHANCEMENT SURGERY Bilateral     BREAST LUMPECTOMY Left     CESAREAN SECTION      x2    HYSTERECTOMY (CERVIX STATUS UNKNOWN)      KNEE SURGERY Right 03/2011    TONSILLECTOMY      US  BREAST BIOPSY W LOC DEVICE 1ST LESION LEFT Left 05/08/2023    US  BREAST BIOPSY W LOC DEVICE 1ST LESION LEFT 05/08/2023 [facility]      Cancer-related family history includes Cervical Cancer in her maternal aunt; Colon Cancer in her maternal grandmother; Stomach Cancer in her father.   Social History     Socioeconomic History    Marital  status: Married     Spouse name: None    Number of children: None    Years of education: None    Highest education level: None   Tobacco Use    Smoking status: Never    Smokeless tobacco: Never   Substance and Sexual Activity    Alcohol use: Never    Drug use: Never          Review of Systems    General/Constitutional:           Fever denies.  Chills denies.  Headache denies.  Fatigue denies.       Breast:           Breast lump denies.  Breast pain denies.  Nipple discharge denies.  Red skin denies.     SEE HPI  Musculoskeletal:           Back pain denies.  Muscle aches admits.  Painful joints admits.       Skin:           Rash denies.  Skin cancer denies.  Skin lesion(s) denies.               General Examination:  Ht 1.715 m (5' 7.5)   Wt 77.1 kg (170 lb)   BMI 26.23 kg/m             GENERAL APPEARANCE: well developed, well nourished, Patient is alert and oriented X 3. No acute distress. Appropriate affect, looks stated age.          HEAD: normocephalic, atraumatic.          NECK: neck supple, full range of motion.          LYMPH NODES:  no axillary, supraclavicular or cervical adenopathy.          BREASTS:  The patient was examined in the supine and upright positions.                    RIGHT Breast: periareolar scar present; the skin, nipple and areola are normal. No nipple discharge was able to be expressed today. There are no retractions or dimpling noted. There is generalized nodularity without a dominant mass. The axillary tail is normal. Implant present                    LEFT Breast: breast is smaller and rests higher in comparison to the RIGHT, scar present in the upper breast, lateral breast and axilla; the skin, nipple and areola are otherwise normal. No  nipple discharge was able to be expressed today. There are no retractions or dimpling noted. There is generalized nodularity without a dominant mass. The axillary tail is normal. Implant present          SKIN: good turgor, no rashes noted, warm and  dry.          MUSCULOSKELETAL: full range of motion, no swelling or deformity.          EXTREMITIES: no edema, clubbing or cyanosis.     Radiology:  Mammogram Result (most recent):  MAM TOMO DIGITAL SCREEN AUGMENTED BILATERAL 05/03/2024    Narrative  SCREENING MAMMOGRAM: 05/03/24    INDICATION: Z12.31,Encounter for screening mammogram for malignant neoplasm of  breast,ICD-10-CM.    COMPARISON: Prior mammograms 05/08/2023 dating back to 09/14/2016    TECHNIQUE: BILATERAL 3D tomosynthesis and 2D digital mammography. Implant  displaced views performed.    BREAST DENSITY: The breasts are heterogeneously dense, which may obscure small  masses.    FINDINGS:  There are bilateral retropectoral silicone implants which may limit mammographic  positioning and visualization of the breast tissue.  Right: No suspicious masses, calcifications, or architectural distortion.    Left: Post surgical changes. No suspicious masses calcifications, or nonsurgical  architectural distortion. There is a biopsy marker in the upper outer quadrant.    Impression  No suspicious mammographic finding.    OVERALL BIRADS: 2:  Benign    RECOMMENDATION(S):  Bilateral Screening Mammogram in 1 year        Results will be sent to the patient via the electronic medical record and/or a  physical letter.  Performing Facility: [facility] [address] [place] [address] Phone: [phone]     ----------------------------------------------------------------------------------------------------------------------  US  Result (most recent):  US  BREAST COMPLETE LEFT 05/03/2024    Narrative  COMPLETE BILATERAL BREAST ULTRASOUND SCREENING: 05/03/24    INDICATION: R92.30,Dense breasts, unspecified,ICD-10-CM supplemental imaging  Supplemental breast screening.    COMPARISON: 09/04/2023, 05/08/2023, 05/01/2023, 04/05/2022    ULTRASOUND:  Complete breast ultrasound was performed by the sonographer. All 4  quadrants and  the retroareolar regions were scanned.    Right: Heterogeneous background echotexture. No suspicious masses or associated  features. Partially visualized breast implant.    Left: Heterogeneous background echotexture. No suspicious masses or associated  features. Partially visualized breast implant.    Impression  No suspicious finding in either breast by ultrasound.    OVERALL BIRADS: 2: Benign    RECOMMENDATION(S):  BILATERAL Return to Annual Screening Mammogram        Performing Facility: [facility] [address] [place] [address] Phone: [phone]       Assessment:    ICD-10-CM    1. Personal history of breast cancer  Z85.3 [facility] R[facility], Hematology & Oncology, [place]      2. Dense breast tissue  R92.30       3. Lymphedema of breast  I89.0       4. Lymphedema of left upper extremity  I89.0             Plan:   68 y.o. woman presents for routine surveillance regarding her history of left breast cancer. Discussed benign CBE findings from today along with recent mammogram and screening breast u/s. She is requesting lab work for her breast cancer. Will refer back to medical oncology for this. She will be due for  CBE in 6 months. Pt in agreement with plan.     I spent a total of 20 minutes reviewing the records, imaging studies, reports and counseling the patient regarding the recommended treatment plan for her breast care and documentation in patient record.    Leonette Croak, PA-C

## 2024-05-10 ENCOUNTER — Ambulatory Visit
Admission: RE | Admit: 2024-05-10 | Discharge: 2024-05-10 | Disposition: A | Discharge status: Home / Self Care | Source: Ambulatory Visit | Attending: Endocrinology | Admitting: Endocrinology

## 2024-05-10 DIAGNOSIS — E042 Nontoxic multinodular goiter: Secondary | ICD-10-CM

## 2024-05-23 ENCOUNTER — Encounter

## 2024-05-28 ENCOUNTER — Ambulatory Visit: Admit: 2024-05-28 | Discharge: 2024-05-28 | Payer: MEDICARE | Attending: Medical | Primary: Family Medicine

## 2024-05-28 ENCOUNTER — Other Ambulatory Visit: Admit: 2024-05-28 | Discharge: 2024-05-28 | Payer: MEDICARE | Primary: Family Medicine

## 2024-05-28 VITALS — BP 133/75 | HR 94 | Temp 98.10000°F | Wt 173.8 lb

## 2024-05-28 DIAGNOSIS — D508 Other iron deficiency anemias: Principal | ICD-10-CM

## 2024-05-28 LAB — CBC WITH AUTO DIFFERENTIAL
Basophils %: 1.2 % (ref 0.0–2.0)
Basophils Absolute: 0 x10e3/mcL (ref 0–2)
Eosinophils %: 2.2 % (ref 0.0–7.0)
Eosinophils Absolute: 0.1 x10e3/mcL (ref 0.0–0.5)
Hematocrit: 39.7 % (ref 34.0–47.0)
Hemoglobin: 12.7 g/dL (ref 11.5–15.7)
Immature Grans (Abs): 0.02 x10e3/mcL (ref 0.00–0.06)
Immature Granulocytes %: 0.3 % (ref 0.0–0.6)
Lymphocytes Absolute: 1.6 x10e3/mcL (ref 1.0–3.2)
Lymphocytes: 26.9 % (ref 15.0–45.0)
MCH: 30.3 pg (ref 27.0–34.5)
MCHC: 32 g/dL (ref 30.0–36.0)
MCV: 94.7 fL (ref 81.0–99.0)
MPV: 9.3 fL (ref 7.0–12.2)
Monocytes %: 8.1 % (ref 4.0–12.0)
Monocytes Absolute: 0.5 x10e3/mcL (ref 0.3–1.0)
Neutrophils %: 61.3 % (ref 42.0–74.0)
Neutrophils Absolute: 3.6 x10e3/mcL (ref 1.6–7.3)
Platelets: 269 x10e3/mcL (ref 140–440)
RBC: 4.19 x10e3/mcL (ref 3.60–5.20)
RDW: 14 % (ref 10.0–17.0)
WBC: 5.9 x10e3/mcL (ref 3.8–10.6)

## 2024-05-28 LAB — COMPREHENSIVE METABOLIC PANEL
ALT: 13 U/L (ref 0–42)
AST: 35 U/L (ref 0–46)
Albumin/Globulin Ratio: 1.38 (ref 1.00–2.70)
Albumin: 4.4 g/dL (ref 3.5–5.2)
Alk Phosphatase: 89 U/L (ref 35–117)
Anion Gap: 9 mmol/L (ref 2–17)
BUN: 22 mg/dL (ref 8–23)
CO2: 27 mmol/L (ref 22–29)
Calcium: 9.5 mg/dL (ref 8.8–10.2)
Chloride: 103 mmol/L (ref 98–107)
Creatinine: 0.9 mg/dL (ref 0.5–1.0)
Est, Glom Filt Rate: 68 mL/min/1.73mÂ² (ref >=60)
Globulin: 3.2 g/dL (ref 1.9–4.4)
Glucose: 92 mg/dL (ref 70–99)
Potassium: 4.1 mmol/L (ref 3.5–5.3)
Sodium: 139 mmol/L (ref 135–145)
Total Bilirubin: 0.43 mg/dL (ref 0.00–1.20)
Total Protein: 7.6 g/dL (ref 6.4–8.3)

## 2024-05-28 MED ORDER — ESTRING 2 MG VA RING
2 | Freq: Once | VAGINAL | 3 refills | Status: DC
Start: 2024-05-28 — End: 2024-05-30

## 2024-05-29 NOTE — Telephone Encounter (Signed)
 Estradiol  is too expensive.  Is there a generic available?  Is there a cream? Can we get a approval to get this at a lower cost, currently $600 with insurance Medicare/[employer] for drug. $300 OOP w/coupon. She is not 65 so she can't use the coupon.     Estrace is also listed, is this a possibility for the cream, vaginal, (or tablet) that could be better for the situation?       Please call cell [phone]     KDS

## 2024-05-30 MED ORDER — IMVEXXY STARTER PACK 4 MCG VA INST
4 | VAGINAL | 0 refills | 84.00000 days | Status: DC
Start: 2024-05-30 — End: 2024-07-09

## 2024-07-08 ENCOUNTER — Encounter: Attending: Orthopaedic Surgery | Primary: Family Medicine

## 2024-07-08 ENCOUNTER — Ambulatory Visit: Admit: 2024-07-08 | Discharge: 2024-07-08 | Payer: MEDICARE | Primary: Family Medicine

## 2024-07-08 ENCOUNTER — Ambulatory Visit: Admit: 2024-07-08 | Discharge: 2024-07-08 | Payer: MEDICARE | Attending: Orthopaedic Surgery | Primary: Family Medicine

## 2024-07-08 DIAGNOSIS — M25512 Pain in left shoulder: Principal | ICD-10-CM

## 2024-07-08 MED ORDER — METHYLPREDNISOLONE ACETATE 40 MG/ML IJ SUSP
40 | Freq: Once | INTRAMUSCULAR | Status: AC
Start: 2024-07-08 — End: 2024-07-08
  Administered 2024-07-08: 18:00:00 40 mg via INTRA_ARTICULAR

## 2024-07-08 MED ORDER — METHYLPREDNISOLONE 4 MG PO TBPK
4 | PACK | ORAL | 0 refills | Status: AC
Start: 2024-07-08 — End: ?
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
## 2024-07-09 ENCOUNTER — Ambulatory Visit: Admit: 2024-07-09 | Discharge: 2024-07-09 | Payer: MEDICARE | Attending: Medical | Primary: Family Medicine

## 2024-07-09 VITALS — BP 130/84 | HR 86 | Temp 97.20000°F | Wt 177.6 lb

## 2024-07-09 DIAGNOSIS — C50212 Malignant neoplasm of upper-inner quadrant of left female breast: Secondary | ICD-10-CM

## 2024-07-09 MED ORDER — IMVEXXY MAINTENANCE PACK 4 MCG VA INST
4 | VAGINAL | 5 refills | Status: AC
Start: 2024-07-09 — End: ?

## 2024-07-09 NOTE — Progress Notes (Signed)
 "Progress Note    Date:07/09/2024        Patient Name:Brandi Fritz     Date of Birth:10/31/1955     Age:68 y.o.    Subjective   Interval History Status: not changed.     Brandi Fritz comes in today for follow up regarding her vaginal dryness and dyspareunia.  Her last visit we discussed estrogen use in patients with estrogen receptor positive breast cancer.  We agreed that given the degree of her symptoms the benefit outweighed the risk of trying some topical estrogen.  We decided to use the Estring  however it was very expensive and her insurance would not cover it.  We settled on Imvexxy  4 mcg vaginal tablets.  She did the starter pack and has had great results.  She will now do 4 mcg vaginal tablets twice a week so I will renew her prescription.    She is having cervical arthropathy and is seeing Dr. Powell Malady.      Oncology History    No problem history exists.       Review of Systems   Review of Systems   Constitutional:  Negative for fever.   HENT:  Negative for mouth sores and trouble swallowing.    Respiratory:  Negative for cough, chest tightness and shortness of breath.    Cardiovascular:  Negative for chest pain and leg swelling.   Gastrointestinal:  Negative for abdominal distention, abdominal pain, blood in stool, constipation, diarrhea, nausea and vomiting.   Genitourinary:  Negative for dysuria.   Skin:  Negative for color change.   Neurological:  Negative for dizziness, speech difficulty and headaches.   Hematological:  Negative for adenopathy.   Psychiatric/Behavioral:  Negative for agitation and confusion.         Medications   Scheduled Meds:   Continuous Infusions:   PRN Meds:     Past History    Past Medical History:   has a past medical history of Breast cancer (HCC), Depression, Fibromyalgia, History of therapeutic radiation, and Thyroid disease.    Social History:   reports that she has never smoked. She has never used smokeless tobacco. She reports that she does not drink alcohol and  does not use drugs.     Family History:   Family History   Problem Relation Age of Onset    Stomach Cancer Father     Colon Cancer Maternal Grandmother     Cervical Cancer Maternal Aunt        Physical Examination      Vitals:  BP 130/84 (BP Site: Right Upper Arm, Patient Position: Sitting, BP Cuff Size: Medium Adult)   Pulse 86   Temp 97.2 F (36.2 C) (Temporal)   Wt 80.6 kg (177 lb 9.6 oz)   SpO2 98%   BMI 27.41 kg/m         No data to display                Physical Exam  Constitutional:       General: She is not in acute distress.  Cardiovascular:      Rate and Rhythm: Normal rate and regular rhythm.      Heart sounds: No murmur heard.  Pulmonary:      Effort: Pulmonary effort is normal.      Breath sounds: No wheezing, rhonchi or rales.   Abdominal:      General: Bowel sounds are normal. There is no distension.      Tenderness: There  is no abdominal tenderness.   Musculoskeletal:         General: No swelling.   Lymphadenopathy:      Cervical: No cervical adenopathy.   Neurological:      General: No focal deficit present.      Mental Status: She is alert and oriented to person, place, and time.          Labs/Imaging/Diagnostics   Labs:  CBC:No results for input(s): WBC, RBC, HGB, HCT, MCV, RDW, PLT in the last 72 hours.  CHEMISTRIES:No results for input(s): NA, K, CL, CO2, BUN, CREATININE, GLUCOSE, PHOS, MG in the last 72 hours.    Invalid input(s): CA  PT/INR:No results for input(s): PROTIME, INR in the last 72 hours.  APTT:No results for input(s): APTT in the last 72 hours.  LIVER PROFILE:No results for input(s): AST, ALT, BILIDIR, BILITOT, ALKPHOS in the last 72 hours.    Imaging Last 24 Hours:  XR SHOULDER LEFT (MIN 2 VIEWS)  Result Date: 07/08/2024  3 views left shoulder preserved glenohumeral joint space on axillary lateral view.  Mild narrowing acromiohumeral distance and type III acromion         Assessment        1. 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  She will use 1 insert twice a week.   I will see her for follow-up regarding her history of breast cancer and estrogen use in 6 months.  She will call for any problems in the interim.  I reviewed her thyroid ultrasound which showed a TI-RADS 4 and 5 on some nodules.  She has annual follow-up ultrasound.  This is followed by Dr. Carmelia.    Electronically signed by RONAL JACKQULINE SNIDE, PA-C on 07/09/24 at 2:13 PM EDT   "

## 2024-08-10 ENCOUNTER — Emergency Department: Admit: 2024-08-11 | Payer: MEDICARE | Primary: Family Medicine

## 2024-08-10 DIAGNOSIS — J189 Pneumonia, unspecified organism: Secondary | ICD-10-CM

## 2024-08-10 NOTE — ED Provider Notes (Signed)
 " [facility] EMERGENCY DEPT  EMERGENCY DEPARTMENT ENCOUNTER      Pt Name: Brandi Fritz  MRN: [id number]  Birthdate 23-Aug-1956  Date of evaluation: 08/10/2024  Provider: Eloyse Causey, DO    CHIEF COMPLAINT       Chief Complaint   Patient presents with    Cough     Patient states that she has had cough for last three days and congestion. States has been trying to take OTC meds but getting worse. Also states throat is starting to hurt. Ambulatory.          HISTORY OF PRESENT ILLNESS Note limiting factors.   Patient reports cough for 3 days. She has a headache, congestion. Maybe some fever earlier today but took tylenol PTA. She has had bronchitis and PNA before but no lung issues otherwise and never a smoker. She has tried OTC meds without success. She denies sob, leg swelling. She reports that the cough is intermittently productive and that she has had a hoarse voice as well that started this all    The history is provided by the patient. No language interpreter was used.       Nursing Notes were reviewed.  REVIEW OF SYSTEMS       Review of Systems   HENT:  Positive for congestion and sore throat.    Respiratory:  Positive for cough. Negative for shortness of breath, wheezing and stridor.    Cardiovascular:  Negative for chest pain, palpitations and leg swelling.     Except as noted above the remainder of the review of systems was reviewed and negative.   PAST MEDICAL HISTORY     Past Medical History:   Diagnosis Date    Breast cancer (HCC)     left    Depression     Fibromyalgia     History of therapeutic radiation     Thyroid disease      SURGICAL HISTORY       Past Surgical History:   Procedure Laterality Date    BREAST ENHANCEMENT SURGERY Bilateral     BREAST LUMPECTOMY Left     CESAREAN SECTION      x2    HYSTERECTOMY (CERVIX STATUS UNKNOWN)      KNEE SURGERY Right 03/2011    TONSILLECTOMY      US  BREAST BIOPSY W LOC DEVICE 1ST LESION LEFT Left 05/08/2023    US  BREAST BIOPSY W LOC DEVICE 1ST LESION LEFT 05/08/2023 [facility]      CURRENT MEDICATIONS       Current Discharge Medication List        CONTINUE these medications which have NOT CHANGED    Details   Estradiol  (IMVEXXY  MAINTENANCE PACK) 4 MCG INST One insert twice a week  Qty: 8 each, Refills: 5      methylPREDNISolone  (MEDROL  DOSEPACK) 4 MG tablet Take as prescribed in packet  Qty: 1 kit, Refills: 0    Associated Diagnoses: Trigger point of left side of body      acyclovir (ZOVIRAX) 400 MG tablet TAKE ONE TO TWO TABLETS BY MOUTH ONE TIME DAILY OR AS DIRECTED      TYRVAYA 0.03 MG/ACT SOLN INSTILL 1 SPRAY IN EACH NOSTRIL TWICE DAILY (EVERY 12 HOURS).      omeprazole (PRILOSEC) 40 MG delayed release capsule       celecoxib (CELEBREX) 200 MG capsule       amphetamine-dextroamphetamine (ADDERALL XR) 20 MG extended release capsule       levothyroxine (SYNTHROID)  100 MCG tablet       cyclobenzaprine (FLEXERIL) 10 MG tablet 1 tablet Orally as needed      Esomeprazole Magnesium (NEXIUM) 10 MG PACK 1 tab Oral      fluconazole (DIFLUCAN) 200 MG tablet TAKE ONE TABLET BY MOUTH ONE TIME DAILY OR AS DIRECTED      clonazePAM (KLONOPIN) 0.5 MG tablet 1 tablet at bedtime Orally Once a day      gabapentin (NEURONTIN) 300 MG capsule 2 Tablets Orally once a day at bedtime      thyroid (ARMOUR THYROID) 90 MG tablet 1 tablet Orally Once a day for 90 days      traMADol (ULTRAM) 50 MG tablet 1 tablet Orally as needed           ALLERGIES     Penicillins and Sulfa antibiotics  FAMILY HISTORY       Family History   Problem Relation Age of Onset    Stomach Cancer Father     Colon Cancer Maternal Grandmother     Cervical Cancer Maternal Aunt         SOCIAL HISTORY       Social History     Socioeconomic History    Marital status: Married   Tobacco Use    Smoking status: Never    Smokeless tobacco: Never   Substance and Sexual Activity    Alcohol use: Never    Drug use: Never     SCREENINGS       Glasgow Coma Scale  Eye Opening: Spontaneous  Best Verbal Response: Oriented  Best Motor Response: Obeys  commands  Glasgow Coma Scale Score: 15             CIWA Assessment  BP: 120/73  Pulse: 91           PHYSICAL EXAM       ED Triage Vitals [08/10/24 2304]   BP Girls Systolic BP Percentile Girls Diastolic BP Percentile Boys Systolic BP Percentile Boys Diastolic BP Percentile Temp Temp Source Pulse   120/73 -- -- -- -- 98.1 F (36.7 C) Oral 91      Respirations SpO2 Height Weight - Scale       20 100 % 1.702 m (5' 7) 77.1 kg (170 lb)           Physical Exam  Constitutional:       Appearance: Normal appearance.   HENT:      Head: Normocephalic and atraumatic.      Nose: Nose normal.      Mouth/Throat:      Mouth: Mucous membranes are moist.      Pharynx: No oropharyngeal exudate or posterior oropharyngeal erythema.   Eyes:      Extraocular Movements: Extraocular movements intact.      Pupils: Pupils are equal, round, and reactive to light.   Cardiovascular:      Rate and Rhythm: Normal rate and regular rhythm.      Pulses: Normal pulses.      Heart sounds: Normal heart sounds.   Pulmonary:      Effort: Pulmonary effort is normal.      Breath sounds: Normal breath sounds.      Comments: Dry, barking cough  Abdominal:      General: Abdomen is flat.      Palpations: Abdomen is soft.   Musculoskeletal:         General: Normal range of motion.      Cervical back: Normal  range of motion.   Skin:     General: Skin is warm.      Capillary Refill: Capillary refill takes less than 2 seconds.   Neurological:      General: No focal deficit present.      Mental Status: She is alert and oriented to person, place, and time.   Psychiatric:         Mood and Affect: Mood normal.         Behavior: Behavior normal.         DIAGNOSTIC RESULTS     RADIOLOGY:   Non-plain film images such as CT, Ultrasound and MRI are read by the radiologist. Plain radiographic images are visualized and preliminarily interpreted by the emergency physician with the below findings:    Interpretation per the Radiologist below, if available at the time of this  note:    XR CHEST (2 VW)   Final Result      Focal consolidative opacity in the right upper lung, suspicious for pneumonia in    the appropriate clinical context. Recommend follow-up two-view chest radiograph    in 4-6 weeks to ensure complete resolution and exclude underlying lesion.    Alternatively if the patient has strong risk factors for malignancy, CT chest    should be performed for further evaluation.             LABS:  Labs Reviewed   COVID-19 & INFLUENZA COMBO [facility])    Narrative:     Is this test for diagnosis or screening?->Diagnosis of ill patient  Symptomatic for COVID-19 as defined by CDC?->Yes  Hospitalized for COVID-19?->No  Admitted to ICU for COVID-19?->No  Pregnant?->No       All other labs were within normal range or not returned as of this dictation.  EMERGENCY DEPARTMENT COURSE and DIFFERENTIAL DIAGNOSIS/MDM:   Vitals:    Vitals:    08/10/24 2304   BP: 120/73   Pulse: 91   Resp: 20   Temp: 98.1 F (36.7 C)   TempSrc: Oral   SpO2: 100%   Weight: 77.1 kg (170 lb)   Height: 1.702 m (5' 7)       Medical Decision Making  Feeling better here now. No hypoxia, no fever. She is well appearing. PNA on CXR. PO Abx for home, tessalon . We discussed follow up XRAY and she has pcp    Amount and/or Complexity of Data Reviewed  Labs: ordered. Decision-making details documented in ED Course.  Radiology: ordered and independent interpretation performed. Decision-making details documented in ED Course.    Risk  Prescription drug management.           REASSESSMENT          PROCEDURES:  Unless otherwise noted below, none   Procedures      FINAL IMPRESSION      1. Pneumonia of right upper lobe due to infectious organism          DISPOSITION/PLAN   DISPOSITION Decision To Discharge 08/11/2024 12:08:22 AM      PATIENT REFERRED TO:  Carmelia Tod Hailstone, MD  [address]  [place] [address]  [phone]    Call in 1 day        DISCHARGE MEDICATIONS:  Current Discharge Medication List         START taking these medications    Details   benzonatate  (TESSALON ) 200 MG capsule Take 1 capsule by mouth 3 times daily as needed for Cough  Qty:  15 capsule, Refills: 0      doxycycline  hyclate (VIBRA -TABS) 100 MG tablet Take 1 tablet by mouth 2 times daily for 7 days  Qty: 14 tablet, Refills: 0      HYDROcodone  homatropine (HYCODAN ) 5-1.5 MG/5ML solution Take 5 mLs by mouth every 6 hours as needed (cough) for up to 7 days. Max Daily Amount: 20 mLs  Qty: 75 mL, Refills: 0    Associated Diagnoses: Pneumonia of right upper lobe due to infectious organism           (Please note that portions of this note were completed with a voice recognition program.  Efforts were made to edit the dictations but occasionally words are mis-transcribed.)    Brylin Stanislawski, DO (electronically signed)  Attending Emergency Physician           Annah Asberry LABOR, DO  08/11/24 0011    "

## 2024-08-11 ENCOUNTER — Inpatient Hospital Stay
Admit: 2024-08-11 | Discharge: 2024-08-11 | Disposition: A | Discharge status: Home / Self Care | Payer: MEDICARE | Arrived: WI | Attending: Emergency Medicine

## 2024-08-11 LAB — COVID-19 & INFLUENZA COMBO (LIAT HOSPITAL)
Influenza A: NOT DETECTED
Influenza B: NOT DETECTED
SARS-CoV-2: NOT DETECTED

## 2024-08-11 MED ORDER — DOXYCYCLINE HYCLATE 100 MG PO TABS
100 | ORAL_TABLET | Freq: Two times a day (BID) | ORAL | 0 refills | 7.00000 days | Status: AC
Start: 2024-08-11 — End: 2024-08-18

## 2024-08-11 MED ORDER — DEXAMETHASONE SOD PHOSPHATE PF 10 MG/ML IJ SOLN
10 | Freq: Once | INTRAMUSCULAR | Status: AC
Start: 2024-08-11 — End: 2024-08-10
  Administered 2024-08-11: 04:00:00 6 mg via ORAL

## 2024-08-11 MED ORDER — HYDROCODONE BIT-HOMATROP MBR 5-1.5 MG/5ML PO SOLN
5-1.5 | Freq: Four times a day (QID) | ORAL | 0 refills | 30.00000 days | Status: AC | PRN
Start: 2024-08-11 — End: 2024-08-18

## 2024-08-11 MED ORDER — DOXYCYCLINE HYCLATE 100 MG PO CAPS
100 | Freq: Once | ORAL | Status: AC
Start: 2024-08-11 — End: 2024-08-11
  Administered 2024-08-11: 05:00:00 100 mg via ORAL

## 2024-08-11 MED ORDER — BENZONATATE 200 MG PO CAPS
200 | ORAL_CAPSULE | Freq: Three times a day (TID) | ORAL | 0 refills | 7.00000 days | Status: AC | PRN
Start: 2024-08-11 — End: 2024-08-21

## 2024-08-11 MED ORDER — BENZONATATE 100 MG PO CAPS
100 | Freq: Once | ORAL | Status: AC
Start: 2024-08-11 — End: 2024-08-10
  Administered 2024-08-11: 05:00:00 200 mg via ORAL

## 2024-08-11 MED ORDER — HYDROCODONE BIT-HOMATROP MBR 5-1.5 MG/5ML PO SOLN
5-1.5 | Freq: Once | ORAL | Status: AC
Start: 2024-08-11 — End: 2024-08-10
  Administered 2024-08-11: 04:00:00 5 mL via ORAL

## 2024-08-11 MED ORDER — ALBUTEROL SULFATE HFA 108 (90 BASE) MCG/ACT IN AERS
108 | Freq: Once | RESPIRATORY_TRACT | Status: AC
Start: 2024-08-11 — End: 2024-08-10
  Administered 2024-08-11: 04:00:00 2 via RESPIRATORY_TRACT

## 2024-08-11 MED FILL — ALBUTEROL SULFATE HFA 108 (90 BASE) MCG/ACT IN AERS: 108 (90 Base) MCG/ACT | RESPIRATORY_TRACT | Qty: 8.5 | Fill #0

## 2024-08-11 MED FILL — HYDROCODONE BIT-HOMATROP MBR 5-1.5 MG/5ML PO SOLN: 5-1.5 MG/5ML | ORAL | Qty: 5 | Fill #0

## 2024-08-11 MED FILL — DEXAMETHASONE SOD PHOSPHATE PF 10 MG/ML IJ SOLN: 10 mg/mL | INTRAMUSCULAR | Qty: 1 | Fill #0

## 2024-08-11 MED FILL — BENZONATATE 100 MG PO CAPS: 100 mg | ORAL | Qty: 2 | Fill #0

## 2024-08-11 MED FILL — DOXYCYCLINE HYCLATE 100 MG PO CAPS: 100 mg | ORAL | Qty: 1 | Fill #0

## 2024-08-11 NOTE — Discharge Instructions (Signed)
"  Please see your doctor for a follow up chest XRAY in 4-6 weeks we we discussed. Return sooner for fever, vomiting, shortness of breath, or any other new or concerning symptoms!  "

## 2024-10-30 ENCOUNTER — Encounter: Attending: Surgical | Primary: Family Medicine
